# Patient Record
Sex: Male | Born: 1960 | Race: White | Hispanic: No | Marital: Married | Smoking: Current every day smoker
Health system: Southern US, Community
[De-identification: ages and names within clinical notes are randomized; demographics above are authoritative.]

## PROBLEM LIST (undated history)

## (undated) DIAGNOSIS — M1990 Unspecified osteoarthritis, unspecified site: Secondary | ICD-10-CM

## (undated) DIAGNOSIS — B20 Human immunodeficiency virus [HIV] disease: Secondary | ICD-10-CM

## (undated) DIAGNOSIS — F32A Depression, unspecified: Secondary | ICD-10-CM

## (undated) DIAGNOSIS — A539 Syphilis, unspecified: Secondary | ICD-10-CM

## (undated) DIAGNOSIS — T7840XA Allergy, unspecified, initial encounter: Secondary | ICD-10-CM

## (undated) DIAGNOSIS — M8700 Idiopathic aseptic necrosis of unspecified bone: Secondary | ICD-10-CM

## (undated) DIAGNOSIS — E785 Hyperlipidemia, unspecified: Secondary | ICD-10-CM

## (undated) DIAGNOSIS — F1511 Other stimulant abuse, in remission: Secondary | ICD-10-CM

## (undated) DIAGNOSIS — F329 Major depressive disorder, single episode, unspecified: Secondary | ICD-10-CM

## (undated) HISTORY — PX: CATARACT EXTRACTION: SUR2

## (undated) HISTORY — DX: Depression, unspecified: F32.A

## (undated) HISTORY — DX: Other stimulant abuse, in remission: F15.11

## (undated) HISTORY — DX: Idiopathic aseptic necrosis of unspecified bone: M87.00

## (undated) HISTORY — DX: Allergy, unspecified, initial encounter: T78.40XA

## (undated) HISTORY — DX: Syphilis, unspecified: A53.9

## (undated) HISTORY — DX: Human immunodeficiency virus (HIV) disease: B20

## (undated) HISTORY — DX: Major depressive disorder, single episode, unspecified: F32.9

## (undated) HISTORY — PX: YAG LASER APPLICATION: SHX6189

---
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
## 2010-08-25 DIAGNOSIS — B20 Human immunodeficiency virus [HIV] disease: Secondary | ICD-10-CM

## 2010-08-25 DIAGNOSIS — Z21 Asymptomatic human immunodeficiency virus [HIV] infection status: Secondary | ICD-10-CM

## 2010-08-25 HISTORY — DX: Asymptomatic human immunodeficiency virus (hiv) infection status: Z21

## 2010-08-25 HISTORY — DX: Human immunodeficiency virus (HIV) disease: B20

## 2017-01-31 ENCOUNTER — Other Ambulatory Visit: Payer: Self-pay | Admitting: *Deleted

## 2017-01-31 DIAGNOSIS — Z79899 Other long term (current) drug therapy: Secondary | ICD-10-CM

## 2017-01-31 DIAGNOSIS — B20 Human immunodeficiency virus [HIV] disease: Secondary | ICD-10-CM

## 2017-01-31 DIAGNOSIS — Z113 Encounter for screening for infections with a predominantly sexual mode of transmission: Secondary | ICD-10-CM

## 2017-02-03 ENCOUNTER — Ambulatory Visit: Payer: 59

## 2017-02-03 ENCOUNTER — Other Ambulatory Visit: Payer: 59

## 2017-02-03 ENCOUNTER — Other Ambulatory Visit (HOSPITAL_COMMUNITY)
Admission: RE | Admit: 2017-02-03 | Discharge: 2017-02-03 | Disposition: A | Discharge status: Home / Self Care | Payer: 59 | Source: Ambulatory Visit | Attending: Infectious Diseases | Admitting: Infectious Diseases

## 2017-02-03 DIAGNOSIS — Z113 Encounter for screening for infections with a predominantly sexual mode of transmission: Secondary | ICD-10-CM | POA: Diagnosis not present

## 2017-02-03 DIAGNOSIS — B20 Human immunodeficiency virus [HIV] disease: Secondary | ICD-10-CM

## 2017-02-03 DIAGNOSIS — Z79899 Other long term (current) drug therapy: Secondary | ICD-10-CM

## 2017-02-03 LAB — T-HELPER CELL (CD4) - (RCID CLINIC ONLY)
CD4 % Helper T Cell: 37 % (ref 33–55)
CD4 T Cell Abs: 880 /uL (ref 400–2700)

## 2017-02-04 LAB — URINE CYTOLOGY ANCILLARY ONLY
Chlamydia: NEGATIVE
Neisseria Gonorrhea: NEGATIVE

## 2017-02-04 LAB — URINALYSIS
Bilirubin Urine: NEGATIVE
Glucose, UA: NEGATIVE
Hgb urine dipstick: NEGATIVE
Ketones, ur: NEGATIVE
Leukocytes, UA: NEGATIVE
Nitrite: NEGATIVE
Protein, ur: NEGATIVE
Specific Gravity, Urine: 1.014 (ref 1.001–1.03)
pH: 6 (ref 5.0–8.0)

## 2017-02-05 LAB — QUANTIFERON-TB GOLD PLUS
Mitogen-NIL: >10 IU/mL
NIL: 0.02 IU/mL
QuantiFERON-TB Gold Plus: NEGATIVE
TB1-NIL: 0 IU/mL
TB2-NIL: 0 IU/mL

## 2017-02-06 ENCOUNTER — Encounter: Payer: Self-pay | Admitting: Infectious Diseases

## 2017-02-06 DIAGNOSIS — Z789 Other specified health status: Secondary | ICD-10-CM | POA: Insufficient documentation

## 2017-02-07 LAB — HIV-1 RNA ULTRAQUANT REFLEX TO GENTYP+
HIV 1 RNA Quant: 37 Copies/mL — ABNORMAL HIGH
HIV-1 RNA Quant, Log: 1.56 Log cps/mL — ABNORMAL HIGH

## 2017-02-08 ENCOUNTER — Encounter: Payer: Self-pay | Admitting: Infectious Diseases

## 2017-02-08 DIAGNOSIS — Z789 Other specified health status: Secondary | ICD-10-CM | POA: Insufficient documentation

## 2017-02-08 LAB — CBC WITH DIFFERENTIAL/PLATELET
Basophils Absolute: 72 cells/uL (ref 0–200)
Basophils Relative: 1.1 %
Eosinophils Absolute: 260 cells/uL (ref 15–500)
Eosinophils Relative: 4 %
HCT: 42.4 % (ref 38.5–50.0)
Hemoglobin: 14.9 g/dL (ref 13.2–17.1)
Lymphs Abs: 2288 cells/uL (ref 850–3900)
MCH: 31.6 pg (ref 27.0–33.0)
MCHC: 35.1 g/dL (ref 32.0–36.0)
MCV: 89.8 fL (ref 80.0–100.0)
MPV: 9.2 fL (ref 7.5–12.5)
Monocytes Relative: 6.1 %
Neutro Abs: 3484 cells/uL (ref 1500–7800)
Neutrophils Relative %: 53.6 %
Platelets: 346 10*3/uL (ref 140–400)
RBC: 4.72 10*6/uL (ref 4.20–5.80)
RDW: 13.3 % (ref 11.0–15.0)
Total Lymphocyte: 35.2 %
WBC mixed population: 397 cells/uL (ref 200–950)
WBC: 6.5 10*3/uL (ref 3.8–10.8)

## 2017-02-08 LAB — HEPATITIS B CORE ANTIBODY, TOTAL: Hep B Core Total Ab: NONREACTIVE

## 2017-02-08 LAB — HEPATITIS B SURFACE ANTIBODY,QUALITATIVE: Hep B S Ab: REACTIVE — AB

## 2017-02-08 LAB — HEPATITIS A ANTIBODY, TOTAL: Hepatitis A AB,Total: REACTIVE — AB

## 2017-02-08 LAB — COMPLETE METABOLIC PANEL WITH GFR
AG Ratio: 1.7 (calc) (ref 1.0–2.5)
ALT: 25 U/L (ref 9–46)
AST: 17 U/L (ref 10–35)
Albumin: 4.5 g/dL (ref 3.6–5.1)
Alkaline phosphatase (APISO): 83 U/L (ref 40–115)
BUN: 14 mg/dL (ref 7–25)
CO2: 29 mmol/L (ref 20–32)
Calcium: 9.8 mg/dL (ref 8.6–10.3)
Chloride: 103 mmol/L (ref 98–110)
Creat: 0.98 mg/dL (ref 0.70–1.33)
GFR, Est African American: 101 mL/min/{1.73_m2} (ref >=60)
GFR, Est Non African American: 87 mL/min/{1.73_m2} (ref >=60)
Globulin: 2.6 g/dL (calc) (ref 1.9–3.7)
Glucose, Bld: 136 mg/dL — ABNORMAL HIGH (ref 65–99)
Potassium: 4.7 mmol/L (ref 3.5–5.3)
Sodium: 139 mmol/L (ref 135–146)
Total Bilirubin: 0.4 mg/dL (ref 0.2–1.2)
Total Protein: 7.1 g/dL (ref 6.1–8.1)

## 2017-02-08 LAB — LIPID PANEL
Cholesterol: 394 mg/dL — ABNORMAL HIGH (ref <200)
HDL: 47 mg/dL (ref >40)
Non-HDL Cholesterol (Calc): 347 mg/dL (calc) — ABNORMAL HIGH (ref <130)
Total CHOL/HDL Ratio: 8.4 (calc) — ABNORMAL HIGH (ref <5.0)
Triglycerides: 752 mg/dL — ABNORMAL HIGH (ref <150)

## 2017-02-08 LAB — RPR: RPR Ser Ql: REACTIVE — AB

## 2017-02-08 LAB — HLA B*5701: HLA-B*5701 w/rflx HLA-B High: NEGATIVE

## 2017-02-08 LAB — HEPATITIS B SURFACE ANTIGEN: Hepatitis B Surface Ag: NONREACTIVE

## 2017-02-08 LAB — FLUORESCENT TREPONEMAL AB(FTA)-IGG-BLD: Fluorescent Treponemal ABS: REACTIVE — AB

## 2017-02-08 LAB — HEPATITIS C ANTIBODY
Hepatitis C Ab: NONREACTIVE
SIGNAL TO CUT-OFF: 0.02 (ref <1.00)

## 2017-02-08 LAB — RPR TITER: RPR Titer: 1:8 {titer} — ABNORMAL HIGH

## 2017-02-22 ENCOUNTER — Ambulatory Visit (INDEPENDENT_AMBULATORY_CARE_PROVIDER_SITE_OTHER): Payer: 59 | Admitting: Licensed Clinical Social Worker

## 2017-02-22 ENCOUNTER — Ambulatory Visit (INDEPENDENT_AMBULATORY_CARE_PROVIDER_SITE_OTHER): Payer: 59 | Admitting: Pharmacist

## 2017-02-22 ENCOUNTER — Ambulatory Visit: Payer: 59 | Admitting: Infectious Diseases

## 2017-02-22 ENCOUNTER — Encounter: Payer: Self-pay | Admitting: Infectious Diseases

## 2017-02-22 ENCOUNTER — Telehealth: Payer: Self-pay | Admitting: Pharmacist

## 2017-02-22 VITALS — BP 135/71 | HR 84 | Temp 97.5°F | Ht 74.0 in | Wt 207.0 lb

## 2017-02-22 DIAGNOSIS — E782 Mixed hyperlipidemia: Secondary | ICD-10-CM

## 2017-02-22 DIAGNOSIS — F329 Major depressive disorder, single episode, unspecified: Secondary | ICD-10-CM | POA: Diagnosis not present

## 2017-02-22 DIAGNOSIS — B20 Human immunodeficiency virus [HIV] disease: Secondary | ICD-10-CM

## 2017-02-22 DIAGNOSIS — E785 Hyperlipidemia, unspecified: Secondary | ICD-10-CM | POA: Diagnosis not present

## 2017-02-22 DIAGNOSIS — Z634 Disappearance and death of family member: Secondary | ICD-10-CM

## 2017-02-22 DIAGNOSIS — A539 Syphilis, unspecified: Secondary | ICD-10-CM | POA: Insufficient documentation

## 2017-02-22 DIAGNOSIS — F32A Depression, unspecified: Secondary | ICD-10-CM

## 2017-02-22 DIAGNOSIS — J302 Other seasonal allergic rhinitis: Secondary | ICD-10-CM

## 2017-02-22 DIAGNOSIS — M8700 Idiopathic aseptic necrosis of unspecified bone: Secondary | ICD-10-CM | POA: Diagnosis not present

## 2017-02-22 MED ORDER — CETIRIZINE HCL 10 MG PO TABS: 10.0000 mg | ORAL_TABLET | Freq: Every day | ORAL | Status: DC

## 2017-02-22 MED ORDER — ATORVASTATIN CALCIUM 10 MG PO TABS
10.0000 mg | ORAL_TABLET | Freq: Every day | ORAL | 3 refills | Status: DC
Start: 2017-02-22 — End: 2017-11-27

## 2017-02-22 NOTE — BH Specialist Note (Signed)
Integrated Behavioral Health Initial Visit  MRN: [id number] Name: Jason Townsend  Number of [place] Clinician visits:: 1/6 Session Start time: 9:59 am  Session End time: 10:04 am Total time: 5 mins  Type of Service: [place] Interpretor:No. Interpretor Name and Language: N/A   Warm Hand Off Completed.       SUBJECTIVE: Jason Townsend is a 57 y.o. male accompanied by self Patient was referred by Dr. Johnnye Sima for being a new HIV referral.  Patient reports the following symptoms/concerns: Patient reported that he is well adjusted to his diagnosis as he was diagnosed in 2012.  Patient reported having good social support and denied need for support groups.  Patient reported that his sleep is "situational" as his father passed away last week.  [facility] educated patient on the grief counseling and support groups offered at [facility] and he stated that he was already familiar with their services.  Patient stated that he will seek services from them when he is ready but he was better prepared for his father's passing, as he was receiving hospice services.  Patient denied need for additional resources or counseling services.  Duration of problem: past week; Severity of problem: mild  OBJECTIVE: Mood: Euthymic and Affect: Appropriate Risk of harm to self or others: No plan to harm self or others  ASSESSMENT: Patient is currently experiencing grief from his father's death and may benefit from grief counseling when he is ready.  GOALS ADDRESSED: Patient will: 1. Reduce symptoms of: grief 2. Increase knowledge and/or ability of: coping skills and stages of grief  3. Demonstrate ability to: Increase healthy adjustment to current life circumstances and Begin healthy grieving over loss  INTERVENTIONS: Interventions utilized: Supportive Counseling and Link to [employer]   PLAN: 1. Patient was provided with a  business card and will contact the [facility] for additional assistance if needed.   2. Referral(s): grief counseling at [place], [place]

## 2017-02-22 NOTE — Telephone Encounter (Signed)
Thanks so much. 

## 2017-02-22 NOTE — Progress Notes (Signed)
[place] for Infectious Disease Pharmacy Visit  HPI: Jason Townsend is a 57 y.o. male who presents to the [facility] clinic today to initiate care with Dr. Johnnye Sima as a transfer HIV patient.  Patient Active Problem List   Diagnosis Date Noted  . HIV disease ([place]) 02/22/2017  . Hyperlipidemia 02/22/2017  . AVN (avascular necrosis of bone) ([place]) 02/22/2017  . Syphilis 02/22/2017  . Depression (emotion) 02/22/2017  . Hepatitis A immune 02/08/2017  . Hepatitis B immune 02/06/2017    Patient's Medications  New Prescriptions   No medications on file  Previous Medications   ACETAMINOPHEN (TYLENOL) 500 MG TABLET    Take by mouth.   ATORVASTATIN (LIPITOR) 10 MG TABLET    Take 1 tablet (10 mg total) by mouth daily.   DICLOFENAC (VOLTAREN) 75 MG EC TABLET    Take by mouth.   IBUPROFEN (ADVIL,MOTRIN) 200 MG TABLET    Take by mouth.   MULTIPLE VITAMINS-MINERALS (THERA-M) TABS    Take by mouth.   TRIUMEQ 600-50-300 MG TABLET    TK 1 T PO QD  Modified Medications   No medications on file  Discontinued Medications   No medications on file    Allergies: Not on File  Past Medical History: Past Medical History:  Diagnosis Date  . AVN (avascular necrosis of bone) ([facility])    L hip  . Depression   . HIV (human immunodeficiency virus infection) ([place]) 08/2010   HLA (-), previous K103N  . 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 Syphilis    treated    Social History: Social History   Socioeconomic History  . Marital status: Soil scientist    Spouse name: Not on file  . Number of children: Not on file  . Years of education: Not on file  . Highest education level: Not on file  Social Needs  . Financial resource strain: Not on file  . Food insecurity - worry: Not on file  . Food insecurity - inability: Not on file  . Transportation needs - medical: Not on file  . Transportation needs - non-medical: Not on file  Occupational History  . Not on file  Tobacco Use  . Smoking  status: Current Every Day Smoker    Packs/day: 0.50    Types: Cigarettes  . Smokeless tobacco: Never Used  . Tobacco comment: talking about it   Substance and Sexual Activity  . Alcohol use: No    Frequency: Never  . Drug use: No  . Sexual activity: Yes    Partners: Male  Other Topics Concern  . Not on file  Social History Narrative  . Not on file    Labs: HIV 1 RNA Quant (Copies/mL)  Date Value  02/03/2017 37 (H)   CD4 T Cell Abs (/uL)  Date Value  02/03/2017 880   Hep B S Ab (no units)  Date Value  02/03/2017 REACTIVE (A)   Hepatitis B Surface Ag (no units)  Date Value  02/03/2017 NON-REACTIVE    Lipids:    Component Value Date/Time   CHOL 394 (H) 02/03/2017 0930   TRIG 752 (H) 02/03/2017 0930   HDL 47 02/03/2017 0930   CHOLHDL 8.4 (H) 02/03/2017 0930    Current HIV Regimen: Triumeq  Assessment: Jason Townsend is here today to initiate care with Dr. Johnnye Sima as a transfer HIV patient. He was diagnosed back in 2012 and has been on Triumeq since 2015. He is doing very well on Triumeq with no side effects or  issues taking it.  Dr. Johnnye Sima would also like to start him on a low dose statin for his cholesterol (total 394, TG 752). I counseled him on how to take Lipitor and possible side effects like myalgias. He will come back and see me in 6 weeks to do LFTs, recheck lipids, and see how he is tolerating it.  Plan: - Continue Triumeq PO once daily - Start Lipitor 10 mg PO once daily  - F/u with me 3/13 at 930am  Ichelle Harral L. Sherran Margolis, PharmD, [place], [place] for Infectious Disease 02/22/2017, 10:02 AM

## 2017-02-22 NOTE — Assessment & Plan Note (Signed)
Feels like he is in a good place, wants to hold on meeting counselor today.

## 2017-02-22 NOTE — Telephone Encounter (Signed)
Called Jason Townsend to help him with a co-pay card for his Triumeq.  Activated the card for him and gave him all the numbers.  I told him to call me if he has any issues.  ID: [id number] BIN: [id number] PCN: [id number] Group: [id number]

## 2017-02-22 NOTE — Assessment & Plan Note (Addendum)
vax are uptodate Colonoscopy pending.  Tet not done since '83, will update Doing well on his triumeq.  Explained clinic to him In committed relationship. Positive.  rtc in 3 months.

## 2017-02-22 NOTE — Assessment & Plan Note (Signed)
Previously treated with 2 wks PEN.  Will follow [facility].

## 2017-02-22 NOTE — Progress Notes (Signed)
   Subjective:    Patient ID: Jason Townsend, male    DOB: Nov 29, 1960, 57 y.o.   MRN: [id number]  HPI 57 yo M with hx of HIV+ (dx 08-2010, currently on triumeq 2013/07/06), initially on truvada/darunavir-norvir/intelence, HLA-, prev K103N), previously followed at [facility].  He also has a hx of depression and sees a mental health provider. Prev partner died from suicide 07/07/15.  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 Marland Kitchen  His father died last week (dementia, subdural after fall).  Moved here in October (works at [employer]).  Getting married next month.   HIV 1 RNA Quant (Copies/mL)  Date Value  02/03/2017 37 (H)   CD4 T Cell Abs (/uL)  Date Value  02/03/2017 880   The past medical history, family history and social history were reviewed/updated in EPIC   Review of Systems  Constitutional: Negative for appetite change, chills, fever and unexpected weight change.  Respiratory: Negative for cough and shortness of breath.   Gastrointestinal: Negative for constipation and diarrhea.  Genitourinary: Negative for difficulty urinating.  Musculoskeletal: Positive for arthralgias.  Psychiatric/Behavioral: Negative for dysphoric mood.  occas exercise.  Please see HPI. All other systems reviewed and negative.      Objective:   Physical Exam  Constitutional: He appears well-developed and well-nourished.  HENT:  Mouth/Throat: No oropharyngeal exudate.  Eyes: EOM are normal. Pupils are equal, round, and reactive to light.  Neck: Neck supple.  Cardiovascular: Normal rate, regular rhythm and normal heart sounds.  Pulmonary/Chest: Effort normal and breath sounds normal.  Abdominal: Soft. Bowel sounds are normal. There is no tenderness. There is no rebound.  Musculoskeletal: He exhibits no edema.  Lymphadenopathy:    He has no cervical adenopathy.  Psychiatric: He has a  normal mood and affect.      Assessment & Plan:

## 2017-02-22 NOTE — Assessment & Plan Note (Signed)
He is well controlled on his voltaren.  Will have him seen by ortho.

## 2017-02-22 NOTE — Assessment & Plan Note (Signed)
Will start him on low dose lipitor, cautioned about myalgias.

## 2017-02-27 ENCOUNTER — Encounter: Payer: Self-pay | Admitting: Behavioral Health

## 2017-03-13 ENCOUNTER — Ambulatory Visit (INDEPENDENT_AMBULATORY_CARE_PROVIDER_SITE_OTHER): Payer: Self-pay | Admitting: Orthopaedic Surgery

## 2017-03-20 ENCOUNTER — Encounter: Payer: Self-pay | Admitting: Infectious Diseases

## 2017-03-21 NOTE — Telephone Encounter (Signed)
Patient is a [employer], needs to have labs drawn there and faxed to [place].  He is supposed to follow up with pharmacy 3/13 for liver function tests on statin treatment. Please write paper script for labs to be drawn at [facility] before his pharmacy visit. Please also write a 2nd script for any labs you'd like drawn for his 3 month follow up on 5/20. Landis Gandy, RN

## 2017-03-31 ENCOUNTER — Other Ambulatory Visit: Payer: Self-pay | Admitting: *Deleted

## 2017-03-31 DIAGNOSIS — B20 Human immunodeficiency virus [HIV] disease: Secondary | ICD-10-CM

## 2017-03-31 MED ORDER — TRIUMEQ 600-50-300 MG PO TABS: ORAL_TABLET | ORAL | 5 refills | Status: DC

## 2017-04-03 ENCOUNTER — Encounter: Payer: Self-pay | Admitting: Infectious Diseases

## 2017-04-05 ENCOUNTER — Ambulatory Visit: Payer: 59

## 2017-04-18 ENCOUNTER — Encounter: Payer: Self-pay | Admitting: Infectious Diseases

## 2017-04-26 ENCOUNTER — Encounter: Payer: Self-pay | Admitting: Infectious Diseases

## 2017-05-03 ENCOUNTER — Encounter: Payer: Self-pay | Admitting: Infectious Diseases

## 2017-05-15 ENCOUNTER — Ambulatory Visit (INDEPENDENT_AMBULATORY_CARE_PROVIDER_SITE_OTHER): Payer: 59

## 2017-05-15 ENCOUNTER — Ambulatory Visit (INDEPENDENT_AMBULATORY_CARE_PROVIDER_SITE_OTHER): Payer: 59 | Admitting: Orthopaedic Surgery

## 2017-05-15 VITALS — Ht 74.0 in | Wt 197.0 lb

## 2017-05-15 DIAGNOSIS — M25552 Pain in left hip: Secondary | ICD-10-CM

## 2017-05-15 DIAGNOSIS — M1612 Unilateral primary osteoarthritis, left hip: Secondary | ICD-10-CM | POA: Diagnosis not present

## 2017-05-15 NOTE — Progress Notes (Signed)
Office Visit Note   Patient: Jason Townsend           Date of Birth: [birth year]           MRN: [id number] Visit Date: 05/15/2017              Requested by: Campbell Riches, MD [address] [place], [place] [address] PCP: Patient, No Pcp Per   Assessment & Plan: Visit Diagnoses:  1. Pain in left hip   2. Unilateral primary osteoarthritis, left hip     Plan: Given his physical exam and x-ray findings this is not avascular necrosis but is severe osteoarthritis of his left hip.  He is already tried and failed all forms conservative treatment.  He does not even have a joint space left to provide a steroid injection in and I would not recommend this at all.  Given the fact that he has tried activity modification as well as anti-inflammatories and hip strengthening exercises as well as given this time of over 5 years to get better and given the fact that his pain is severe and his x-ray findings are severe we are recommending hip replacement.  I gave him a handout on anterior hip replacement surgery.  We went over his x-rays in detail and had a long and thorough discussion about the risk and benefits of the surgery as well as what his intraoperative and postoperative course were involved.  All questions concerns were answered and addressed.  I gave him our surgery schedulers card and we will also be in touch looking at both my schedule in his schedule.  Follow-Up Instructions: Return for 2 weeks post-op.   Orders:  Orders Placed This Encounter  Procedures  . XR HIP UNILAT W OR W/O PELVIS 2-3 VIEWS LEFT   No orders of the defined types were placed in this encounter.     Procedures: No procedures performed   Clinical Data: No additional findings.   Subjective: Chief Complaint  Patient presents with  . Left Hip - Pain  Patient is a very pleasant 57 year old gentleman who comes in for evaluation treatment of left hip pain.  He is actually lived in both [place]  in [place] and now lives in [place].  He has been having severe and significant left hip pain since about 2012.  He has been told by several different doctors that they are concerned about avascular necrosis based on his stiffness and pain.  He is HIV positive but has an undetectable viral load and a normal CD4 count.  He is tried anti-inflammatories.  He is tried activity modification.  He is a thin individual.  He is worked on hip strengthening exercises as well.  At this point his pain can be 10 out of 10 at times.  It is detrimentally affect is active daily living, his quality of life, his mobility.  Again he is tried conservative treatment now for over 5 years for his left hip.  He is now seeking further evaluation and treatment of this issue.  He does have a dull aching pain at night that does wake him up.  He hurts with pivoting activities.  Driving or sitting for any long period of time does cause severe pain when he gets up.  His pain does wake him up at night as well.  HPI  Review of Systems He currently denies any headache, chest pain, shortness of breath, fever, chills, nausea, vomiting.  Objective: Vital Signs: Ht 6' 2"  (  1.88 m)   Wt 197 lb (89.4 kg)   BMI 25.29 kg/m   Physical Exam He is alert and oriented x3 and in no acute distress Ortho Exam Examination of his right hip is normal examination of his left hip shows severe stiffness with any internal and external rotation and I cannot rotate him fully.  All his pain is in the groin.  His knee foot and ankle exam is normal. Specialty Comments:  No specialty comments available.  Imaging: Xr Hip Unilat W Or W/o Pelvis 2-3 Views Left  Result Date: 05/15/2017 An AP pelvis and lateral of the left hip show severe end-stage arthritis of the left hip.  There is complete loss of the joint space.  There is cystic change in the femoral head and the acetabulum.  There are sclerotic changes as well.  There are large para-articular  osteophytes.    PMFS History: Patient Active Problem List   Diagnosis Date Noted  . Unilateral primary osteoarthritis, left hip 05/15/2017  . HIV disease ([place]) 02/22/2017  . Hyperlipidemia 02/22/2017  . AVN (avascular necrosis of bone) ([place]) 02/22/2017  . Syphilis 02/22/2017  . Depression (emotion) 02/22/2017  . Hepatitis A immune 02/08/2017  . Hepatitis B immune 02/06/2017   Past Medical History:  Diagnosis Date  . AVN (avascular necrosis of bone) (H[facility])    L hip  . Depression   . HIV (human immunodeficiency virus infection) ([place]) 08/2010   HLA (-), previous K103N  . 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 Syphilis    treated    Family History  Problem Relation Age of Onset  . Dementia Father     Past Surgical History:  Procedure Laterality Date  . CATARACT EXTRACTION Left    Social History   Occupational History  . Not on file  Tobacco Use  . Smoking status: Current Every Day Smoker    Packs/day: 0.50    Types: Cigarettes  . Smokeless tobacco: Never Used  . Tobacco comment: talking about it   Substance and Sexual Activity  . Alcohol use: No    Frequency: Never  . Drug use: No  . Sexual activity: Yes    Partners: Male

## 2017-05-17 ENCOUNTER — Encounter (INDEPENDENT_AMBULATORY_CARE_PROVIDER_SITE_OTHER): Payer: Self-pay | Admitting: Orthopaedic Surgery

## 2017-06-09 ENCOUNTER — Encounter (INDEPENDENT_AMBULATORY_CARE_PROVIDER_SITE_OTHER): Payer: Self-pay | Admitting: Orthopaedic Surgery

## 2017-06-12 ENCOUNTER — Ambulatory Visit: Payer: 59 | Admitting: Infectious Diseases

## 2017-06-12 ENCOUNTER — Other Ambulatory Visit (INDEPENDENT_AMBULATORY_CARE_PROVIDER_SITE_OTHER): Payer: Self-pay | Admitting: Physician Assistant

## 2017-06-12 ENCOUNTER — Other Ambulatory Visit (HOSPITAL_COMMUNITY): Payer: Self-pay | Admitting: *Deleted

## 2017-06-12 NOTE — Patient Instructions (Addendum)
Jason Townsend  06/12/2017   Your procedure is scheduled on: 06-23-17  Report to [facility] Main  Entrance  Report to admitting at  945 am    Call this number if you have problems the morning of surgery [phone]   Remember: Do not eat food or drink liquids  :After Midnight. .                                                      _____________________________________________________________________       Take these medicines the morning of surgery with A SIP OF WATER: ATORVASTATIN (LIPITOR), LORATADINE (CLARITIN), TRIUMEQ                               You may not have any metal on your body including hair pins and              piercings  Do not wear jewelry, make-up, lotions, powders or perfumes, deodorant             Do not wear nail polish.  Do not shave  48 hours prior to surgery.              Men may shave face and neck.   Do not bring valuables to the hospital. [place].  Contacts, dentures or bridgework may not be worn into surgery.  Leave suitcase in the car. After surgery it may be brought to your room.                  Please read over the following fact sheets you were given: _____________________________________________________________________             [facility] - Preparing for Surgery Before surgery, you can play an important role.  Because skin is not sterile, your skin needs to be as free of germs as possible.  You can reduce the number of germs on your skin by washing with CHG (chlorahexidine gluconate) soap before surgery.  CHG is an antiseptic cleaner which kills germs and bonds with the skin to continue killing germs even after washing. Please DO NOT use if you have an allergy to CHG or antibacterial soaps.  If your skin becomes reddened/irritated stop using the CHG and inform your nurse when you arrive at Short Stay. Do not shave (including legs and underarms) for at  least 48 hours prior to the first CHG shower.  You may shave your face/neck. Please follow these instructions carefully:  1.  Shower with CHG Soap the night before surgery and the  morning of Surgery.  2.  If you choose to wash your hair, wash your hair first as usual with your  normal  shampoo.  3.  After you shampoo, rinse your hair and body thoroughly to remove the  shampoo.                           4.  Use CHG as you would any other liquid soap.  You can apply chg directly  to the skin and  wash                       Gently with a scrungie or clean washcloth.  5.  Apply the CHG Soap to your body ONLY FROM THE NECK DOWN.   Do not use on face/ open                           Wound or open sores. Avoid contact with eyes, ears mouth and genitals (private parts).                       Wash face,  Genitals (private parts) with your normal soap.             6.  Wash thoroughly, paying special attention to the area where your surgery  will be performed.  7.  Thoroughly rinse your body with warm water from the neck down.  8.  DO NOT shower/wash with your normal soap after using and rinsing off  the CHG Soap.                9.  Pat yourself dry with a clean towel.            10.  Wear clean pajamas.            11.  Place clean sheets on your bed the night of your first shower and do not  sleep with pets. Day of Surgery : Do not apply any lotions/deodorants the morning of surgery.  Please wear clean clothes to the hospital/surgery center.  FAILURE TO FOLLOW THESE INSTRUCTIONS MAY RESULT IN THE CANCELLATION OF YOUR SURGERY PATIENT SIGNATURE_________________________________  NURSE SIGNATURE__________________________________  ________________________________________________________________________   Jason Townsend  An incentive spirometer is a tool that can help keep your lungs clear and active. This tool measures how well you are filling your lungs with each breath. Taking long deep breaths  may help reverse or decrease the chance of developing breathing (pulmonary) problems (especially infection) following:  A long period of time when you are unable to move or be active. BEFORE THE PROCEDURE   If the spirometer includes an indicator to show your best effort, your nurse or respiratory therapist will set it to a desired goal.  If possible, sit up straight or lean slightly forward. Try not to slouch.  Hold the incentive spirometer in an upright position. INSTRUCTIONS FOR USE  1. Sit on the edge of your bed if possible, or sit up as far as you can in bed or on a chair. 2. Hold the incentive spirometer in an upright position. 3. Breathe out normally. 4. Place the mouthpiece in your mouth and seal your lips tightly around it. 5. Breathe in slowly and as deeply as possible, raising the piston or the ball toward the top of the column. 6. Hold your breath for 3-5 seconds or for as long as possible. Allow the piston or ball to fall to the bottom of the column. 7. Remove the mouthpiece from your mouth and breathe out normally. 8. Rest for a few seconds and repeat Steps 1 through 7 at least 10 times every 1-2 hours when you are awake. Take your time and take a few normal breaths between deep breaths. 9. The spirometer may include an indicator to show your best effort. Use the indicator as a goal to work toward during each repetition. 10. After each set of 10 deep  breaths, practice coughing to be sure your lungs are clear. If you have an incision (the cut made at the time of surgery), support your incision when coughing by placing a pillow or rolled up towels firmly against it. Once you are able to get out of bed, walk around indoors and cough well. You may stop using the incentive spirometer when instructed by your caregiver.  RISKS AND COMPLICATIONS  Take your time so you do not get dizzy or light-headed.  If you are in pain, you may need to take or ask for pain medication before doing  incentive spirometry. It is harder to take a deep breath if you are having pain. AFTER USE  Rest and breathe slowly and easily.  It can be helpful to keep track of a log of your progress. Your caregiver can provide you with a simple table to help with this. If you are using the spirometer at home, follow these instructions: [place] IF:   You are having difficultly using the spirometer.  You have trouble using the spirometer as often as instructed.  Your pain medication is not giving enough relief while using the spirometer.  You develop fever of 100.5 F (38.1 C) or higher. SEEK IMMEDIATE MEDICAL CARE IF:   You cough up bloody sputum that had not been present before.  You develop fever of 102 F (38.9 C) or greater.  You develop worsening pain at or near the incision site. MAKE SURE YOU:   Understand these instructions.  Will watch your condition.  Will get help right away if you are not doing well or get worse. Document Released: 05/23/2006 Document Revised: 04/04/2011 Document Reviewed: 07/24/2006 ExitCare Patient Information 2014 Exit[employer], [place].   ________________________________________________________________________  WHAT IS A BLOOD TRANSFUSION? Blood Transfusion Information  A transfusion is the replacement of blood or some of its parts. Blood is made up of multiple cells which provide different functions.  Red blood cells carry oxygen and are used for blood loss replacement.  White blood cells fight against infection.  Platelets control bleeding.  Plasma helps clot blood.  Other blood products are available for specialized needs, such as hemophilia or other clotting disorders. BEFORE THE TRANSFUSION  Who gives blood for transfusions?   Healthy volunteers who are fully evaluated to make sure their blood is safe. This is blood bank blood. Transfusion therapy is the safest it has ever been in the practice of medicine. Before blood is taken from a  donor, a complete history is taken to make sure that person has no history of diseases nor engages in risky social behavior (examples are intravenous drug use or sexual activity with multiple partners). The donor's travel history is screened to minimize risk of transmitting infections, such as malaria. The donated blood is tested for signs of infectious diseases, such as HIV and hepatitis. The blood is then tested to be sure it is compatible with you in order to minimize the chance of a transfusion reaction. If you or a relative donates blood, this is often done in anticipation of surgery and is not appropriate for emergency situations. It takes many days to process the donated blood. RISKS AND COMPLICATIONS Although transfusion therapy is very safe and saves many lives, the main dangers of transfusion include:   Getting an infectious disease.  Developing a transfusion reaction. This is an allergic reaction to something in the blood you were given. Every precaution is taken to prevent this. The decision to have a blood transfusion has  been considered carefully by your caregiver before blood is given. Blood is not given unless the benefits outweigh the risks. AFTER THE TRANSFUSION  Right after receiving a blood transfusion, you will usually feel much better and more energetic. This is especially true if your red blood cells have gotten low (anemic). The transfusion raises the level of the red blood cells which carry oxygen, and this usually causes an energy increase.  The nurse administering the transfusion will monitor you carefully for complications. HOME CARE INSTRUCTIONS  No special instructions are needed after a transfusion. You may find your energy is better. Speak with your caregiver about any limitations on activity for underlying diseases you may have. SEEK MEDICAL CARE IF:   Your condition is not improving after your transfusion.  You develop redness or irritation at the intravenous (IV)  site. SEEK IMMEDIATE MEDICAL CARE IF:  Any of the following symptoms occur over the next 12 hours:  Shaking chills.  You have a temperature by mouth above 102 F (38.9 C), not controlled by medicine.  Chest, back, or muscle pain.  People around you feel you are not acting correctly or are confused.  Shortness of breath or difficulty breathing.  Dizziness and fainting.  You get a rash or develop hives.  You have a decrease in urine output.  Your urine turns a dark color or changes to pink, red, or brown. Any of the following symptoms occur over the next 10 days:  You have a temperature by mouth above 102 F (38.9 C), not controlled by medicine.  Shortness of breath.  Weakness after normal activity.  The white part of the eye turns yellow (jaundice).  You have a decrease in the amount of urine or are urinating less often.  Your urine turns a dark color or changes to pink, red, or brown. Document Released: 01/08/2000 Document Revised: 04/04/2011 Document Reviewed: 08/27/2007 [facility] Patient Information 2014 [place], [place].  _______________________________________________________________________

## 2017-06-12 NOTE — Progress Notes (Signed)
NEED ORDERS IN Epic FOR 06-23-17 SURGERY PRE OP IS 06-13-17 AT 800 AM THANKS

## 2017-06-13 ENCOUNTER — Other Ambulatory Visit: Payer: Self-pay

## 2017-06-13 ENCOUNTER — Encounter (HOSPITAL_COMMUNITY)
Admission: RE | Admit: 2017-06-13 | Discharge: 2017-06-13 | Disposition: A | Discharge status: Home / Self Care | Payer: 59 | Source: Ambulatory Visit | Attending: Orthopaedic Surgery | Admitting: Orthopaedic Surgery

## 2017-06-13 ENCOUNTER — Encounter (HOSPITAL_COMMUNITY): Payer: Self-pay

## 2017-06-13 DIAGNOSIS — Z01812 Encounter for preprocedural laboratory examination: Secondary | ICD-10-CM | POA: Diagnosis not present

## 2017-06-13 DIAGNOSIS — M1612 Unilateral primary osteoarthritis, left hip: Secondary | ICD-10-CM | POA: Insufficient documentation

## 2017-06-13 HISTORY — DX: Hyperlipidemia, unspecified: E78.5

## 2017-06-13 HISTORY — DX: Unspecified osteoarthritis, unspecified site: M19.90

## 2017-06-13 LAB — CBC
HCT: 43.1 % (ref 39.0–52.0)
Hemoglobin: 14.9 g/dL (ref 13.0–17.0)
MCH: 33.1 pg (ref 26.0–34.0)
MCHC: 34.6 g/dL (ref 30.0–36.0)
MCV: 95.8 fL (ref 78.0–100.0)
Platelets: 298 10*3/uL (ref 150–400)
RBC: 4.5 MIL/uL (ref 4.22–5.81)
RDW: 13.7 % (ref 11.5–15.5)
WBC: 7.9 10*3/uL (ref 4.0–10.5)

## 2017-06-13 LAB — SURGICAL PCR SCREEN
MRSA, PCR: NEGATIVE
Staphylococcus aureus: NEGATIVE

## 2017-06-13 LAB — ABO/RH: ABO/RH(D): O POS

## 2017-06-14 ENCOUNTER — Encounter: Payer: Self-pay | Admitting: Infectious Diseases

## 2017-06-14 ENCOUNTER — Ambulatory Visit: Payer: 59 | Admitting: Infectious Diseases

## 2017-06-14 DIAGNOSIS — B20 Human immunodeficiency virus [HIV] disease: Secondary | ICD-10-CM | POA: Diagnosis not present

## 2017-06-14 DIAGNOSIS — F32A Depression, unspecified: Secondary | ICD-10-CM

## 2017-06-14 DIAGNOSIS — M8700 Idiopathic aseptic necrosis of unspecified bone: Secondary | ICD-10-CM | POA: Diagnosis not present

## 2017-06-14 DIAGNOSIS — E785 Hyperlipidemia, unspecified: Secondary | ICD-10-CM | POA: Diagnosis not present

## 2017-06-14 DIAGNOSIS — F329 Major depressive disorder, single episode, unspecified: Secondary | ICD-10-CM | POA: Diagnosis not present

## 2017-06-14 NOTE — Assessment & Plan Note (Addendum)
Will continue on triumeq His CD4 963 (04-2017) has done at work. His RNA was only done as detected.  Will await further testing.  His vax are up to date.  Husband+, und, genvoya.  tdap next visit Colon appt, derm appt next visit.  Will see him back in 4 months.

## 2017-06-14 NOTE — Assessment & Plan Note (Signed)
continues on lipitor.  Normal LFTs at last blood draw.  No myalgias.

## 2017-06-14 NOTE — Progress Notes (Signed)
   Subjective:    Patient ID: Jason Townsend, male    DOB: 30-Jun-1960, 57 y.o.   MRN: [id number]  HPI 57 yo M with hx of HIV+ (dx 08-2010, currently on triumeq 07-24-13), initially on truvada/darunavir-norvir/intelence, HLA-, prev K103N), previously followed at [facility].  He also has a hx of depression and sees a mental health provider. Prev partner died from suicide 25-Jul-2015.  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 Marland Kitchen  He is to have L THR 06-23-17.  Got married 03-2017. Moving to [place]. Helping move his mother as well as selling another house.  No problems with ART. Rarely forgets to take. Can count on 1 hand number of missed.  Worried about decreased libido and statin. No muscle aches.   HIV 1 RNA Quant (Copies/mL)  Date Value  02/03/2017 37 (H)   CD4 T Cell Abs (/uL)  Date Value  02/03/2017 880    Review of Systems  Constitutional: Negative for appetite change and unexpected weight change.  Gastrointestinal: Negative for constipation and diarrhea.  Genitourinary: Negative for difficulty urinating.  Musculoskeletal: Positive for arthralgias. Negative for myalgias.  has not been able to exercise due to hip.  Please see HPI. All other systems reviewed and negative.     Objective:   Physical Exam  Constitutional: He is oriented to person, place, and time. He appears well-developed and well-nourished.  HENT:  Mouth/Throat: No oropharyngeal exudate.  Eyes: Pupils are equal, round, and reactive to light. EOM are normal.  Neck: Normal range of motion. Neck supple.  Cardiovascular: Normal rate, regular rhythm and normal heart sounds.  Pulmonary/Chest: Effort normal and breath sounds normal.  Abdominal: Soft. Bowel sounds are normal. There is no tenderness. There is no guarding.  Neurological: He is alert and oriented to person, place, and time.  Skin:           Assessment & Plan:

## 2017-06-14 NOTE — Assessment & Plan Note (Signed)
For L THR on 06-23-17.

## 2017-06-14 NOTE — Assessment & Plan Note (Signed)
mood is good.

## 2017-06-15 ENCOUNTER — Other Ambulatory Visit (INDEPENDENT_AMBULATORY_CARE_PROVIDER_SITE_OTHER): Payer: Self-pay

## 2017-06-15 ENCOUNTER — Telehealth (INDEPENDENT_AMBULATORY_CARE_PROVIDER_SITE_OTHER): Payer: Self-pay | Admitting: Orthopaedic Surgery

## 2017-06-15 NOTE — Telephone Encounter (Signed)
Patient aware to stop five days prior

## 2017-06-15 NOTE — Telephone Encounter (Signed)
Patient left vm stating he has surgery 5/31 and he needs to know when to stop taking the non steroidal antiinflammatory drugs (225 mg generic diclofenac) and also mulitvitamins - preop told him he would need to stop taking them sometime before surgery but did not tell him when to stop. Please advise # [phone]

## 2017-06-22 MED ORDER — TRANEXAMIC ACID 1000 MG/10ML IV SOLN
1000.0000 mg | INTRAVENOUS | Status: AC
  Administered 2017-06-23: 1000 mg via INTRAVENOUS
  Filled 2017-06-22: qty 10

## 2017-06-23 ENCOUNTER — Inpatient Hospital Stay (HOSPITAL_COMMUNITY)
Admission: RE | Admit: 2017-06-23 | Discharge: 2017-06-24 | DRG: 470 | Disposition: A | Discharge status: Home Health | Payer: 59 | Source: Ambulatory Visit | Attending: Orthopaedic Surgery | Admitting: Orthopaedic Surgery

## 2017-06-23 ENCOUNTER — Inpatient Hospital Stay (HOSPITAL_COMMUNITY): Payer: 59 | Admitting: Certified Registered Nurse Anesthetist

## 2017-06-23 ENCOUNTER — Inpatient Hospital Stay (HOSPITAL_COMMUNITY): Payer: 59

## 2017-06-23 ENCOUNTER — Other Ambulatory Visit: Payer: Self-pay

## 2017-06-23 ENCOUNTER — Encounter (HOSPITAL_COMMUNITY)
Admission: RE | Disposition: A | Discharge status: Home Health | Payer: Self-pay | Source: Ambulatory Visit | Attending: Orthopaedic Surgery

## 2017-06-23 ENCOUNTER — Encounter (HOSPITAL_COMMUNITY): Payer: Self-pay | Admitting: Certified Registered Nurse Anesthetist

## 2017-06-23 DIAGNOSIS — M1612 Unilateral primary osteoarthritis, left hip: Principal | ICD-10-CM | POA: Diagnosis present

## 2017-06-23 DIAGNOSIS — F1511 Other stimulant abuse, in remission: Secondary | ICD-10-CM | POA: Diagnosis present

## 2017-06-23 DIAGNOSIS — F17210 Nicotine dependence, cigarettes, uncomplicated: Secondary | ICD-10-CM | POA: Diagnosis present

## 2017-06-23 DIAGNOSIS — F329 Major depressive disorder, single episode, unspecified: Secondary | ICD-10-CM | POA: Diagnosis present

## 2017-06-23 DIAGNOSIS — Z9842 Cataract extraction status, left eye: Secondary | ICD-10-CM

## 2017-06-23 DIAGNOSIS — B20 Human immunodeficiency virus [HIV] disease: Secondary | ICD-10-CM | POA: Diagnosis present

## 2017-06-23 DIAGNOSIS — Z96642 Presence of left artificial hip joint: Secondary | ICD-10-CM

## 2017-06-23 DIAGNOSIS — Z419 Encounter for procedure for purposes other than remedying health state, unspecified: Secondary | ICD-10-CM

## 2017-06-23 DIAGNOSIS — E785 Hyperlipidemia, unspecified: Secondary | ICD-10-CM | POA: Diagnosis present

## 2017-06-23 HISTORY — PX: TOTAL HIP ARTHROPLASTY: SHX124

## 2017-06-23 LAB — TYPE AND SCREEN
ABO/RH(D): O POS
Antibody Screen: NEGATIVE

## 2017-06-23 SURGERY — ARTHROPLASTY, HIP, TOTAL, ANTERIOR APPROACH
Anesthesia: Monitor Anesthesia Care | Site: Hip | Laterality: Left

## 2017-06-23 MED ORDER — CEFAZOLIN SODIUM-DEXTROSE 1-4 GM/50ML-% IV SOLN
1.0000 g | Freq: Four times a day (QID) | INTRAVENOUS | Status: AC
  Administered 2017-06-23 (×2): 1 g via INTRAVENOUS
  Filled 2017-06-23 (×2): qty 50

## 2017-06-23 MED ORDER — PHENOL 1.4 % MT LIQD: 1.0000 | OROMUCOSAL | Status: DC | PRN

## 2017-06-23 MED ORDER — ASPIRIN 81 MG PO CHEW
81.0000 mg | CHEWABLE_TABLET | Freq: Two times a day (BID) | ORAL | Status: DC
  Administered 2017-06-23 – 2017-06-24 (×2): 81 mg via ORAL
  Filled 2017-06-23 (×2): qty 1

## 2017-06-23 MED ORDER — EPHEDRINE SULFATE-NACL 50-0.9 MG/10ML-% IV SOSY
PREFILLED_SYRINGE | INTRAVENOUS | Status: DC | PRN
  Administered 2017-06-23 (×3): 10 mg via INTRAVENOUS

## 2017-06-23 MED ORDER — ACETAMINOPHEN 325 MG PO TABS: 325.0000 mg | ORAL_TABLET | Freq: Four times a day (QID) | ORAL | Status: DC | PRN

## 2017-06-23 MED ORDER — SODIUM CHLORIDE 0.9 % IR SOLN
Status: DC | PRN
  Administered 2017-06-23: 1000 mL

## 2017-06-23 MED ORDER — CEFAZOLIN SODIUM-DEXTROSE 2-4 GM/100ML-% IV SOLN
2.0000 g | INTRAVENOUS | Status: AC
  Administered 2017-06-23: 2 g via INTRAVENOUS
  Filled 2017-06-23: qty 100

## 2017-06-23 MED ORDER — CHLORHEXIDINE GLUCONATE 4 % EX LIQD: 60.0000 mL | Freq: Once | CUTANEOUS | Status: DC

## 2017-06-23 MED ORDER — DOCUSATE SODIUM 100 MG PO CAPS
100.0000 mg | ORAL_CAPSULE | Freq: Two times a day (BID) | ORAL | Status: DC
  Administered 2017-06-23 – 2017-06-24 (×2): 100 mg via ORAL
  Filled 2017-06-23 (×2): qty 1

## 2017-06-23 MED ORDER — LORATADINE 10 MG PO TABS
10.0000 mg | ORAL_TABLET | Freq: Every day | ORAL | Status: DC
  Administered 2017-06-24: 10 mg via ORAL
  Filled 2017-06-23: qty 1

## 2017-06-23 MED ORDER — LACTATED RINGERS IV SOLN
INTRAVENOUS | Status: DC
  Administered 2017-06-23 (×3): via INTRAVENOUS

## 2017-06-23 MED ORDER — ONDANSETRON HCL 4 MG/2ML IJ SOLN: 4.0000 mg | Freq: Four times a day (QID) | INTRAMUSCULAR | Status: DC | PRN

## 2017-06-23 MED ORDER — METOCLOPRAMIDE HCL 5 MG PO TABS: 5.0000 mg | ORAL_TABLET | Freq: Three times a day (TID) | ORAL | Status: DC | PRN

## 2017-06-23 MED ORDER — DEXAMETHASONE SODIUM PHOSPHATE 10 MG/ML IJ SOLN
INTRAMUSCULAR | Status: AC
  Filled 2017-06-23: qty 1

## 2017-06-23 MED ORDER — PHENYLEPHRINE 40 MCG/ML (10ML) SYRINGE FOR IV PUSH (FOR BLOOD PRESSURE SUPPORT)
PREFILLED_SYRINGE | INTRAVENOUS | Status: AC
  Filled 2017-06-23: qty 10

## 2017-06-23 MED ORDER — EPHEDRINE SULFATE 50 MG/ML IJ SOLN
INTRAMUSCULAR | Status: AC
Start: 2017-06-23 — End: ?
  Filled 2017-06-23: qty 1

## 2017-06-23 MED ORDER — SODIUM CHLORIDE 0.9 % IV SOLN
INTRAVENOUS | Status: DC
  Administered 2017-06-23: 18:00:00 via INTRAVENOUS

## 2017-06-23 MED ORDER — POLYETHYLENE GLYCOL 3350 17 G PO PACK: 17.0000 g | PACK | Freq: Every day | ORAL | Status: DC | PRN

## 2017-06-23 MED ORDER — ONDANSETRON HCL 4 MG PO TABS: 4.0000 mg | ORAL_TABLET | Freq: Four times a day (QID) | ORAL | Status: DC | PRN

## 2017-06-23 MED ORDER — ALUM & MAG HYDROXIDE-SIMETH 200-200-20 MG/5ML PO SUSP
30.0000 mL | ORAL | Status: DC | PRN
Start: 2017-06-23 — End: 2017-06-24

## 2017-06-23 MED ORDER — STERILE WATER FOR IRRIGATION IR SOLN
Status: DC | PRN
  Administered 2017-06-23: 2000 mL

## 2017-06-23 MED ORDER — GABAPENTIN 100 MG PO CAPS
100.0000 mg | ORAL_CAPSULE | Freq: Three times a day (TID) | ORAL | Status: DC
  Administered 2017-06-23 – 2017-06-24 (×3): 100 mg via ORAL
  Filled 2017-06-23 (×3): qty 1

## 2017-06-23 MED ORDER — ATORVASTATIN CALCIUM 10 MG PO TABS
10.0000 mg | ORAL_TABLET | Freq: Every day | ORAL | Status: DC
  Administered 2017-06-24: 10 mg via ORAL
  Filled 2017-06-23: qty 1

## 2017-06-23 MED ORDER — OXYCODONE HCL 5 MG PO TABS: 5.0000 mg | ORAL_TABLET | Freq: Once | ORAL | Status: DC | PRN

## 2017-06-23 MED ORDER — FENTANYL CITRATE (PF) 100 MCG/2ML IJ SOLN
INTRAMUSCULAR | Status: DC | PRN
  Administered 2017-06-23 (×2): 50 ug via INTRAVENOUS

## 2017-06-23 MED ORDER — DEXAMETHASONE SODIUM PHOSPHATE 10 MG/ML IJ SOLN
INTRAMUSCULAR | Status: DC | PRN
  Administered 2017-06-23: 10 mg via INTRAVENOUS

## 2017-06-23 MED ORDER — FENTANYL CITRATE (PF) 100 MCG/2ML IJ SOLN
INTRAMUSCULAR | Status: AC
  Filled 2017-06-23: qty 2

## 2017-06-23 MED ORDER — ONDANSETRON HCL 4 MG/2ML IJ SOLN
INTRAMUSCULAR | Status: DC | PRN
  Administered 2017-06-23: 4 mg via INTRAVENOUS

## 2017-06-23 MED ORDER — PROPOFOL 10 MG/ML IV BOLUS
INTRAVENOUS | Status: AC
  Filled 2017-06-23: qty 20

## 2017-06-23 MED ORDER — MENTHOL 3 MG MT LOZG: 1.0000 | LOZENGE | OROMUCOSAL | Status: DC | PRN

## 2017-06-23 MED ORDER — PANTOPRAZOLE SODIUM 40 MG PO TBEC
40.0000 mg | DELAYED_RELEASE_TABLET | Freq: Every day | ORAL | Status: DC
  Administered 2017-06-23 – 2017-06-24 (×2): 40 mg via ORAL
  Filled 2017-06-23 (×2): qty 1

## 2017-06-23 MED ORDER — PROPOFOL 500 MG/50ML IV EMUL
INTRAVENOUS | Status: DC | PRN
  Administered 2017-06-23: 100 ug/kg/min via INTRAVENOUS

## 2017-06-23 MED ORDER — PROPOFOL 10 MG/ML IV BOLUS
INTRAVENOUS | Status: AC
  Filled 2017-06-23: qty 60

## 2017-06-23 MED ORDER — METHOCARBAMOL 500 MG PO TABS
500.0000 mg | ORAL_TABLET | Freq: Four times a day (QID) | ORAL | Status: DC | PRN
  Administered 2017-06-23 – 2017-06-24 (×2): 500 mg via ORAL
  Filled 2017-06-23 (×2): qty 1

## 2017-06-23 MED ORDER — MIDAZOLAM HCL 5 MG/5ML IJ SOLN
INTRAMUSCULAR | Status: DC | PRN
  Administered 2017-06-23: 2 mg via INTRAVENOUS

## 2017-06-23 MED ORDER — OXYCODONE HCL 5 MG/5ML PO SOLN
5.0000 mg | Freq: Once | ORAL | Status: DC | PRN
  Filled 2017-06-23: qty 5

## 2017-06-23 MED ORDER — METOCLOPRAMIDE HCL 5 MG/ML IJ SOLN: 5.0000 mg | Freq: Three times a day (TID) | INTRAMUSCULAR | Status: DC | PRN

## 2017-06-23 MED ORDER — ABACAVIR-DOLUTEGRAVIR-LAMIVUD 600-50-300 MG PO TABS
1.0000 | ORAL_TABLET | Freq: Every day | ORAL | Status: DC
  Administered 2017-06-24: 1 via ORAL
  Filled 2017-06-23: qty 1

## 2017-06-23 MED ORDER — ZOLPIDEM TARTRATE 5 MG PO TABS: 5.0000 mg | ORAL_TABLET | Freq: Every evening | ORAL | Status: DC | PRN

## 2017-06-23 MED ORDER — ADULT MULTIVITAMIN W/MINERALS CH
1.0000 | ORAL_TABLET | Freq: Every day | ORAL | Status: DC
  Administered 2017-06-24: 1 via ORAL
  Filled 2017-06-23: qty 1

## 2017-06-23 MED ORDER — BUPIVACAINE HCL (PF) 0.75 % IJ SOLN
INTRAMUSCULAR | Status: DC | PRN
  Administered 2017-06-23: 2 mL via INTRATHECAL

## 2017-06-23 MED ORDER — METHOCARBAMOL 1000 MG/10ML IJ SOLN
500.0000 mg | Freq: Four times a day (QID) | INTRAVENOUS | Status: DC | PRN
  Administered 2017-06-23: 500 mg via INTRAVENOUS
  Filled 2017-06-23: qty 550

## 2017-06-23 MED ORDER — PHENYLEPHRINE 40 MCG/ML (10ML) SYRINGE FOR IV PUSH (FOR BLOOD PRESSURE SUPPORT)
PREFILLED_SYRINGE | INTRAVENOUS | Status: DC | PRN
  Administered 2017-06-23 (×5): 80 ug via INTRAVENOUS

## 2017-06-23 MED ORDER — OXYCODONE HCL 5 MG PO TABS
10.0000 mg | ORAL_TABLET | ORAL | Status: DC | PRN
  Administered 2017-06-23 – 2017-06-24 (×3): 10 mg via ORAL
  Filled 2017-06-23 (×3): qty 2

## 2017-06-23 MED ORDER — MIDAZOLAM HCL 2 MG/2ML IJ SOLN
INTRAMUSCULAR | Status: AC
  Filled 2017-06-23: qty 2

## 2017-06-23 MED ORDER — FENTANYL CITRATE (PF) 100 MCG/2ML IJ SOLN: 25.0000 ug | INTRAMUSCULAR | Status: DC | PRN

## 2017-06-23 MED ORDER — DIPHENHYDRAMINE HCL 12.5 MG/5ML PO ELIX: 12.5000 mg | ORAL_SOLUTION | ORAL | Status: DC | PRN

## 2017-06-23 MED ORDER — HYDROMORPHONE HCL 1 MG/ML IJ SOLN: 0.5000 mg | INTRAMUSCULAR | Status: DC | PRN

## 2017-06-23 MED ORDER — OXYCODONE HCL 5 MG PO TABS
5.0000 mg | ORAL_TABLET | ORAL | Status: DC | PRN
  Administered 2017-06-23: 5 mg via ORAL
  Administered 2017-06-24: 10 mg via ORAL
  Filled 2017-06-23 (×3): qty 1

## 2017-06-23 MED ORDER — ONDANSETRON HCL 4 MG/2ML IJ SOLN
INTRAMUSCULAR | Status: AC
  Filled 2017-06-23: qty 2

## 2017-06-23 SURGICAL SUPPLY — 35 items
BAG ZIPLOCK 12X15 (MISCELLANEOUS) IMPLANT
BENZOIN TINCTURE PRP APPL 2/3 (GAUZE/BANDAGES/DRESSINGS) ×2 IMPLANT
BLADE SAW SGTL 18X1.27X75 (BLADE) ×2 IMPLANT
CAPT HIP TOTAL 2 ×2 IMPLANT
COVER PERINEAL POST (MISCELLANEOUS) ×2 IMPLANT
COVER SURGICAL LIGHT HANDLE (MISCELLANEOUS) ×2 IMPLANT
DRAPE STERI IOBAN 125X83 (DRAPES) ×2 IMPLANT
DRAPE U-SHAPE 47X51 STRL (DRAPES) ×4 IMPLANT
DRSG AQUACEL AG ADV 3.5X10 (GAUZE/BANDAGES/DRESSINGS) ×2 IMPLANT
DURAPREP 26ML APPLICATOR (WOUND CARE) ×2 IMPLANT
ELECT REM PT RETURN 15FT ADLT (MISCELLANEOUS) ×2 IMPLANT
GAUZE XEROFORM 1X8 LF (GAUZE/BANDAGES/DRESSINGS) IMPLANT
GLOVE BIO SURGEON STRL SZ7.5 (GLOVE) ×2 IMPLANT
GLOVE BIOGEL PI IND STRL 7.0 (GLOVE) ×4 IMPLANT
GLOVE BIOGEL PI IND STRL 8 (GLOVE) ×2 IMPLANT
GLOVE BIOGEL PI INDICATOR 7.0 (GLOVE) ×4
GLOVE BIOGEL PI INDICATOR 8 (GLOVE) ×2
GLOVE ECLIPSE 8.0 STRL XLNG CF (GLOVE) ×2 IMPLANT
GOWN STRL REUS W/TWL XL LVL3 (GOWN DISPOSABLE) ×8 IMPLANT
HANDPIECE INTERPULSE COAX TIP (DISPOSABLE) ×1
HEAD M SROM 36MM 2 (Hips) ×1 IMPLANT
HOLDER FOLEY CATH W/STRAP (MISCELLANEOUS) ×2 IMPLANT
PACK ANTERIOR HIP CUSTOM (KITS) ×2 IMPLANT
SET HNDPC FAN SPRY TIP SCT (DISPOSABLE) ×1 IMPLANT
SROM M HEAD 36MM 2 (Hips) ×2 IMPLANT
STAPLER VISISTAT 35W (STAPLE) IMPLANT
STRIP CLOSURE SKIN 1/2X4 (GAUZE/BANDAGES/DRESSINGS) ×2 IMPLANT
SUT ETHIBOND NAB CT1 #1 30IN (SUTURE) ×2 IMPLANT
SUT MNCRL AB 4-0 PS2 18 (SUTURE) IMPLANT
SUT VIC AB 0 CT1 36 (SUTURE) ×2 IMPLANT
SUT VIC AB 1 CT1 36 (SUTURE) ×2 IMPLANT
SUT VIC AB 2-0 CT1 27 (SUTURE) ×2
SUT VIC AB 2-0 CT1 TAPERPNT 27 (SUTURE) ×2 IMPLANT
TRAY FOLEY MTR SLVR 16FR STAT (SET/KITS/TRAYS/PACK) ×2 IMPLANT
YANKAUER SUCT BULB TIP 10FT TU (MISCELLANEOUS) ×2 IMPLANT

## 2017-06-23 NOTE — Op Note (Signed)
NAME: Jason Townsend, Jason Townsend [facility] MEDICAL RECORD [id number] ACCOUNT [id number] DATE OF BIRTH:06-Mar-1960 FACILITY: [facility] LOCATION: [facility]-PERIOP PHYSICIAN:Anoop Kerry Fort, MD  OPERATIVE REPORT  DATE OF PROCEDURE:  06/23/2017  DATE OF OPERATION:  06/23/2017  PREOPERATIVE DIAGNOSIS:  Severe osteoarthritis and degenerative joint disease, left hip.  POSTOPERATIVE DIAGNOSIS:  Severe osteoarthritis and degenerative joint disease, left hip.  PROCEDURE:  Left total hip arthroplasty through a direct anterior approach.  IMPLANTS:  DePuy Sector Gription acetabular component size 54, size 36+0 neutral polyethylene liner, size 6 high offset Actis femoral component, size 36+1.5 ceramic hip ball.  SURGEON:  Delia Sitar. Ninfa Linden, MD  ASSISTANT:  Benita Stabile, PA-C   ANESTHESIA:  Spinal.   ANTIBIOTICS:  2 g IV Ancef.  ESTIMATED BLOOD LOSS:  250 mL.  COMPLICATIONS:  None.  INDICATIONS:  The patient is a 57 year old gentleman with debilitating arthritis involving his left hip.  He is HIV positive, and it was felt that this was avascular necrosis given the high incidence in this population.  However, he has been hurting  since 2012, and his x-rays from my standpoint showed that this is more osteoarthritis and not avascular necrosis.  At this point, his pain has become daily, and it is detrimentally affecting his activities of daily living, his quality of life, and  mobility.  He walks with a significant limp, and he does wish to proceed with a total hip arthroplasty.  He has tried and failed all forms of conservative treatment.  At this point, we will proceed with that surgery today.  He understands fully his risk  of acute blood loss anemia, neurovascular injury, fracture, infection, dislocation, and DVT.  He understands the goals are to decrease pain and improve mobility and overall improve quality of life.  DESCRIPTION OF PROCEDURE:  After informed consent was obtained, the left hip was  marked.  He was brought to the operating room where spinal anesthesia was obtained while he was on a stretcher.  He was laid in the supine position on the stretcher.  A  Foley catheter was placed, and both feet had traction boots applied to them.  Next, he was placed supine on the Hana fracture table with a perineal post in place and both legs in an in-line skeletal traction device with no traction applied.  His left  operative hip was prepped and draped with DuraPrep and sterile drapes.  A time-out was called identifying the correct patient and correct left hip.  We then made an incision just inferior and posterior to the anterior iliac spine and carried this  obliquely down the leg.  We dissected down to the tensor fascia lata, and tensor fascia was then divided longitudinally to proceed with a standard approach to the hip.  We identified and cauterized the circumflex vessels.  We identified the hip capsule,  opened the hip capsule in an L-type format, finding a moderate joint effusion and significant periarticular osteoarthritis around the femoral head and neck region.  We placed Cobra retractors around the medial and lateral femoral neck and then made our  femoral neck cut with an oscillating saw just proximal to the lesser trochanter and completed this with an osteotome.  We placed a corkscrew down the femoral head and removed the femoral head in its entirety.  We found a wide area devoid of cartilage and  no evidence of avascular necrosis, but this was definitely osteoarthritis.  We then cleaned the acetabulum remnants and acetabular labrum and other debris.  I placed a bent Deatra Canter  in the mid acetabular rim and then began reaming in stepwise increments  from a size 43 reamer up to a size 53.  With the 53 reamer, we placed this under direct fluoroscopy as well so we could obtain our depth of reaming, our inclination and anteversion.  We then placed the real DePuy Sector Gription acetabular cup size 54   without any difficulty and a 36+0 acetabular liner for a size 54 acetabular component.  We then turned attention to the femur.  With the leg externally rotated to 120 degrees, extended and adducted, we placed a Mueller retractor medially and a Hohmann  retractor behind the greater trochanter.  We released the lateral joint capsule and used a box-cutting osteotome in the femoral canal and a rongeur to lateralize.  We then began broaching using the Actis broaching system from a size 1 up to a size 6.   With a size 6 in place, we tried a high-offset femoral neck and a 36 -2 hip ball and reduced this in the acetabulum, and we felt like this gave [place] the correct leg length.  We then removed all instrumentation and placed the real Actis high-offset size 6  femoral component and a 36 -2 metal hip ball and reduced this in the acetabulum, but I did not feel like it was stable enough.  We dislocated the hip and removed that ball and went with the real 36+1.5 ceramic hip ball, reduced this in the acetabulum,  and I felt it was definitely much more stable.  We then irrigated the soft tissues with normal saline solution as well as the lavage.  We closed the joint capsule with interrupted #1 Ethibond suture, followed by running 0 Vicryl tensor fascia, 0 Vicryl  in the deep tissue, 2-0 Vicryl subcutaneous tissue, 4-0 Monocryl subcuticular stitches and Steri-Strips on the skin.  An Aquacel dressing was applied.  He was taken off the Hana table and taken to recovery room in stable condition.  All final counts were  correct.  There were no complications noted.  Note Benita Stabile, PA-C, assisted the entire case.  His assistance was crucial for facilitating all aspects of this case.  LN/NUANCE  D:06/23/2017 T:06/23/2017 JOB:[id number]

## 2017-06-23 NOTE — Anesthesia Postprocedure Evaluation (Signed)
Anesthesia Post Note  Patient: Jason Townsend  Procedure(s) Performed: LEFT TOTAL HIP ARTHROPLASTY ANTERIOR APPROACH (Left Hip)     Patient location during evaluation: PACU Anesthesia Type: MAC and Spinal Level of consciousness: oriented and awake and alert Pain management: pain level controlled Vital Signs Assessment: post-procedure vital signs reviewed and stable Respiratory status: spontaneous breathing, respiratory function stable and patient connected to nasal cannula oxygen Cardiovascular status: blood pressure returned to baseline and stable Postop Assessment: no headache, no backache and no apparent nausea or vomiting Anesthetic complications: no    Last Vitals:  Vitals:   06/23/17 1400 06/23/17 1422  BP: 108/67 115/75  Pulse: (!) 59 64  Resp: 15 16  Temp:  (!) 36.3 C  SpO2: 100% 98%    Last Pain:  Vitals:   06/23/17 1422  TempSrc:   PainSc: 0-No pain                 Benuel Ly S

## 2017-06-23 NOTE — H&P (Signed)
TOTAL HIP ADMISSION H&P  Patient is admitted for left total hip arthroplasty.  Subjective:  Chief Complaint: left hip pain  HPI: Jason Townsend, 57 y.o. male, has a history of pain and functional disability in the left hip(s) due to arthritis and patient has failed non-surgical conservative treatments for greater than 12 weeks to include NSAID's and/or analgesics, corticosteriod injections, flexibility and strengthening excercises and activity modification.  Onset of symptoms was gradual starting 9 years ago with gradually worsening course since that time.The patient noted no past surgery on the left hip(s).  Patient currently rates pain in the left hip at 10 out of 10 with activity. Patient has night pain, worsening of pain with activity and weight bearing, pain that interfers with activities of daily living, pain with passive range of motion and crepitus. Patient has evidence of subchondral cysts, subchondral sclerosis, periarticular osteophytes and joint space narrowing by imaging studies. This condition presents safety issues increasing the risk of falls.  There is no current active infection.  Patient Active Problem List   Diagnosis Date Noted  . Unilateral primary osteoarthritis, left hip 05/15/2017  . HIV disease ([place]) 02/22/2017  . Hyperlipidemia 02/22/2017  . AVN (avascular necrosis of bone) ([place]) 02/22/2017  . Syphilis 02/22/2017  . Depression (em[place]) 02/22/2017  . Hepatitis A immune 02/08/2017  . Hepatitis B immune 02/06/2017   Past Medical History:  Diagnosis Date  . Arthritis    oa left hip  . AVN (avascular necrosis of bone) ([facility])    L hip  . Depression   . HIV (human immunodeficiency virus infection) ([place]) 08/2010   HLA (-), previous K103N  . Hyperlipidemia   . 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 Syphilis    treated    Past Surgical History:  Procedure Laterality Date  . CATARACT EXTRACTION Left   . wisdom teeth extraction  1982     Current Facility-Administered Medications  Medication Dose Route Frequency Provider Last Rate Last Dose  . ceFAZolin (ANCEF) IVPB 2g/100 mL premix  2 g Intravenous On Call to OR Pete Pelt, PA-C      . chlorhexidine (HIBICLENS) 4 % liquid 4 application  60 mL Topical Once Erskine Emery W, PA-C      . tranexamic acid (CYKLOKAPRON) 1,000 mg in sodium chloride 0.9 % 100 mL IVPB  1,000 mg Intravenous On Call to OR Mcarthur Rossetti, MD       No Known Allergies  Social History   Tobacco Use  . Smoking status: Current Every Day Smoker    Packs/day: 0.50    Years: 20.00    Pack years: 10.00    Types: Cigarettes  . Smokeless tobacco: Never Used  . Tobacco comment: talking about it   Substance Use Topics  . Alcohol use: No    Frequency: Never    Family History  Problem Relation Age of Onset  . Dementia Father      Review of Systems  Musculoskeletal: Positive for joint pain.  All other systems reviewed and are negative.   Objective:  Physical Exam  Constitutional: He is oriented to person, place, and time. He appears well-developed and well-nourished.  HENT:  Head: Normocephalic and atraumatic.  Eyes: Pupils are equal, round, and reactive to light. EOM are normal.  Neck: Normal range of motion. Neck supple.  Cardiovascular: Normal rate and regular rhythm.  Respiratory: Effort normal and breath sounds normal.  GI: Soft. Bowel sounds are normal.  Musculoskeletal:  Left hip: He exhibits decreased range of motion, decreased strength, tenderness and bony tenderness.  Neurological: He is alert and oriented to person, place, and time.  Skin: Skin is warm and dry.  Psychiatric: He has a normal mood and affect.    Vital signs in last 24 hours: Temp:  [99.3 F (37.4 C)] 99.3 F (37.4 C) (05/31 0943) Pulse Rate:  [64] 64 (05/31 0943) Resp:  [18] 18 (05/31 0943) BP: (136)/(61) 136/61 (05/31 0943) SpO2:  [98 %] 98 % (05/31 0943)  Labs:   Estimated body mass  index is 26.37 kg/m as calculated from the following:   Height as of 06/14/17: 6' 2"  (1.88 m).   Weight as of 06/14/17: 205 lb 6.4 oz (93.2 kg).   Imaging Review Plain radiographs demonstrate severe degenerative joint disease of the left hip(s). The bone quality appears to be excellent for age and reported activity level.    Preoperative templating of the joint replacement has been completed, documented, and submitted to the Operating Room personnel in order to optimize intra-operative equipment management.     Assessment/Plan:  End stage arthritis, left hip(s)  The patient history, physical examination, clinical judgement of the provider and imaging studies are consistent with end stage degenerative joint disease of the left hip(s) and total hip arthroplasty is deemed medically necessary. The treatment options including medical management, injection therapy, arthroscopy and arthroplasty were discussed at length. The risks and benefits of total hip arthroplasty were presented and reviewed. The risks due to aseptic loosening, infection, stiffness, dislocation/subluxation,  thromboembolic complications and other imponderables were discussed.  The patient acknowledged the explanation, agreed to proceed with the plan and consent was signed. Patient is being admitted for inpatient treatment for surgery, pain control, PT, OT, prophylactic antibiotics, VTE prophylaxis, progressive ambulation and ADL's and discharge planning.The patient is planning to be discharged home with home health services

## 2017-06-23 NOTE — Anesthesia Procedure Notes (Signed)
Spinal  Patient location during procedure: OR Start time: 06/23/2017 11:34 AM End time: 06/23/2017 11:37 AM Staffing Anesthesiologist: Albertha Ghee, MD Performed: anesthesiologist  Preanesthetic Checklist Completed: patient identified, surgical consent, pre-op evaluation, timeout performed, IV checked, risks and benefits discussed and monitors and equipment checked Spinal Block Patient position: sitting Prep: DuraPrep Patient monitoring: cardiac monitor, continuous pulse ox and blood pressure Approach: midline Location: L3-4 Injection technique: single-shot Needle Needle type: Pencan  Needle gauge: 24 G Needle length: 9 cm Assessment Sensory level: T10 Additional Notes Functioning IV was confirmed and monitors were applied. Sterile prep and drape, including hand hygiene and sterile gloves were used. The patient was positioned and the spine was prepped. The skin was anesthetized with lidocaine.  Free flow of clear CSF was obtained prior to injecting local anesthetic into the CSF.  The spinal needle aspirated freely following injection.  The needle was carefully withdrawn.  The patient tolerated the procedure well.

## 2017-06-23 NOTE — Anesthesia Preprocedure Evaluation (Signed)
Anesthesia Evaluation  Patient identified by MRN, date of birth, ID band Patient awake    Reviewed: Allergy & Precautions, H&P , NPO status , Patient's Chart, lab work & pertinent test results  Airway Mallampati: II   Neck ROM: full    Dental   Pulmonary Current Smoker,    breath sounds clear to auscultation       Cardiovascular negative cardio ROS   Rhythm:regular Rate:Normal     Neuro/Psych PSYCHIATRIC DISORDERS Depression    GI/Hepatic (+)     substance abuse  methamphetamine use,   Endo/Other    Renal/GU      Musculoskeletal  (+) Arthritis ,   Abdominal   Peds  Hematology  (+) HIV,   Anesthesia Other Findings   Reproductive/Obstetrics                             Anesthesia Physical Anesthesia Plan  ASA: III  Anesthesia Plan: Spinal and MAC   Post-op Pain Management:    Induction: Intravenous  PONV Risk Score and Plan: 0 and Ondansetron, Dexamethasone, Propofol infusion, Midazolam and Treatment may vary due to age or medical condition  Airway Management Planned: Simple Face Mask  Additional Equipment:   Intra-op Plan:   Post-operative Plan:   Informed Consent: I have reviewed the patients History and Physical, chart, labs and discussed the procedure including the risks, benefits and alternatives for the proposed anesthesia with the patient or authorized representative who has indicated his/her understanding and acceptance.     Plan Discussed with: CRNA, Anesthesiologist and Surgeon  Anesthesia Plan Comments:         Anesthesia Quick Evaluation

## 2017-06-23 NOTE — Transfer of Care (Signed)
Immediate Anesthesia Transfer of Care Note  Patient: Jason Townsend  Procedure(s) Performed: LEFT TOTAL HIP ARTHROPLASTY ANTERIOR APPROACH (Left Hip)  Patient Location: PACU  Anesthesia Type:Spinal  Level of Consciousness: awake, alert , oriented and patient cooperative  Airway & Oxygen Therapy: Patient Spontanous Breathing  Post-op Assessment: Report given to RN and Post -op Vital signs reviewed and stable  Post vital signs: Reviewed and stable  Last Vitals:  Vitals Value Taken Time  BP 91/64 06/23/2017  1:20 PM  Temp    Pulse 71 06/23/2017  1:22 PM  Resp 17 06/23/2017  1:22 PM  SpO2 95 % 06/23/2017  1:22 PM  Vitals shown include unvalidated device data.  Last Pain:  Vitals:   06/23/17 0943  TempSrc: Oral  PainSc: 5       Patients Stated Pain Goal: 5 (09/47/09 6283)  Complications: No apparent anesthesia complications

## 2017-06-23 NOTE — Evaluation (Signed)
Physical Therapy Evaluation Patient Details Name: Jason Townsend MRN: [id number] DOB: [birth year] Today's Date: 06/23/2017   History of Present Illness  Pt s/p L THR and with hx of HIV  Clinical Impression  Pt s/p L THR and presents with decreased L LE strength/ROM and post op pain limiting functional mobility.  Pt should progress to dc home with family assist.    Follow Up Recommendations Follow surgeon's recommendation for DC plan and follow-up therapies    Equipment Recommendations  None recommended by PT    Recommendations for Other Services       Precautions / Restrictions Precautions Precautions: Fall Restrictions Weight Bearing Restrictions: No Other Position/Activity Restrictions: WBAT      Mobility  Bed Mobility Overal bed mobility: Needs Assistance Bed Mobility: Supine to Sit     Supine to sit: Min assist     General bed mobility comments: cues for sequence and use of R LE to self assist  Transfers Overall transfer level: Needs assistance Equipment used: Rolling walker (2 wheeled) Transfers: Sit to/from Stand Sit to Stand: Min assist         General transfer comment: cues for LE management and use of UEs to self assist  Ambulation/Gait Ambulation/Gait assistance: Min assist Ambulation Distance (Feet): 111 Feet Assistive device: Rolling walker (2 wheeled) Gait Pattern/deviations: Step-to pattern;Decreased step length - right;Decreased step length - left;Shuffle;Trunk flexed Gait velocity: decr   General Gait Details: cues for posture, position from RW and sequence  Stairs            Wheelchair Mobility    Modified Rankin (Stroke Patients Only)       Balance Overall balance assessment: No apparent balance deficits (not formally assessed)                                           Pertinent Vitals/Pain Pain Assessment: 0-10 Pain Score: 7  Pain Location: L hip Pain Descriptors / Indicators: Pressure Pain  Intervention(s): Limited activity within patient's tolerance;Monitored during session;Premedicated before session    Home Living Family/patient expects to be discharged to:: Private residence Living Arrangements: Spouse/significant other Available Help at Discharge: Family Type of Home: House Home Access: Stairs to enter Entrance Stairs-Rails: Right Entrance Stairs-Number of Steps: 2 Home Layout: One level Home Equipment: Environmental consultant - 2 wheels;Cane - single point      Prior Function Level of Independence: Independent               Hand Dominance        Extremity/Trunk Assessment   Upper Extremity Assessment Upper Extremity Assessment: Overall WFL for tasks assessed    Lower Extremity Assessment Lower Extremity Assessment: LLE deficits/detail    Cervical / Trunk Assessment Cervical / Trunk Assessment: Normal  Communication   Communication: No difficulties  Cognition Arousal/Alertness: Awake/alert Behavior During Therapy: WFL for tasks assessed/performed Overall Cognitive Status: Within Functional Limits for tasks assessed                                        General Comments      Exercises Total Joint Exercises Ankle Circles/Pumps: AROM;Both;15 reps;Supine   Assessment/Plan    PT Assessment Patient needs continued PT services  PT Problem List Decreased strength;Decreased range of motion;Decreased activity tolerance;Decreased mobility;Decreased knowledge of use of  DME;Pain       PT Treatment Interventions DME instruction;Gait training;Stair training;Functional mobility training;Therapeutic activities;Therapeutic exercise;Patient/family education    PT Goals (Current goals can be found in the Care Plan section)  Acute Rehab PT Goals Patient Stated Goal: Regain IND PT Goal Formulation: With patient Time For Goal Achievement: 06/30/17 Potential to Achieve Goals: Good    Frequency 7X/week   Barriers to discharge        Co-evaluation                AM-PAC PT "6 Clicks" Daily Activity  Outcome Measure Difficulty turning over in bed (including adjusting bedclothes, sheets and blankets)?: A Lot Difficulty moving from lying on back to sitting on the side of the bed? : A Lot Difficulty sitting down on and standing up from a chair with arms (e.g., wheelchair, bedside commode, etc,.)?: A Lot Help needed moving to and from a bed to chair (including a wheelchair)?: A Little Help needed walking in hospital room?: A Little Help needed climbing 3-5 steps with a railing? : A Little 6 Click Score: 15    End of Session Equipment Utilized During Treatment: Gait belt Activity Tolerance: Patient tolerated treatment well Patient left: in chair;with call bell/phone within reach Nurse Communication: Mobility status PT Visit Diagnosis: Difficulty in walking, not elsewhere classified (R26.2)    Time: 1710-1740 PT Time Calculation (min) (ACUTE ONLY): 30 min   Charges:     PT Treatments $Gait Training: 8-22 mins   PT G Codes:        Pg [phone]   Jason Townsend 06/23/2017, 6:14 PM

## 2017-06-23 NOTE — Brief Op Note (Signed)
06/23/2017  12:58 PM  PATIENT:  Jason Townsend  57 y.o. male  PRE-OPERATIVE DIAGNOSIS:  osteoarthritis left hip  POST-OPERATIVE DIAGNOSIS:  osteoarthritis left hip  PROCEDURE:  Procedure(s): LEFT TOTAL HIP ARTHROPLASTY ANTERIOR APPROACH (Left)  SURGEON:  Surgeon(s) and Role:    Mcarthur Rossetti, MD - Primary  PHYSICIAN ASSISTANT: Benita Stabile, PA-C  ANESTHESIA:   spinal  EBL:  250 mL   COUNTS:  YES  DICTATION: .Other Dictation: Dictation Number [id number]  PLAN OF CARE: Admit to inpatient   PATIENT DISPOSITION:  PACU - hemodynamically stable.   Delay start of Pharmacological VTE agent (>24hrs) due to surgical blood loss or risk of bleeding: no

## 2017-06-24 LAB — CBC
HCT: 37.4 % — ABNORMAL LOW (ref 39.0–52.0)
Hemoglobin: 12.6 g/dL — ABNORMAL LOW (ref 13.0–17.0)
MCH: 31.9 pg (ref 26.0–34.0)
MCHC: 33.7 g/dL (ref 30.0–36.0)
MCV: 94.7 fL (ref 78.0–100.0)
Platelets: 300 10*3/uL (ref 150–400)
RBC: 3.95 MIL/uL — ABNORMAL LOW (ref 4.22–5.81)
RDW: 13.7 % (ref 11.5–15.5)
WBC: 10.8 10*3/uL — ABNORMAL HIGH (ref 4.0–10.5)

## 2017-06-24 LAB — BASIC METABOLIC PANEL
Anion gap: 10 (ref 5–15)
BUN: 13 mg/dL (ref 6–20)
CO2: 24 mmol/L (ref 22–32)
Calcium: 9.1 mg/dL (ref 8.9–10.3)
Chloride: 103 mmol/L (ref 101–111)
Creatinine, Ser: 0.81 mg/dL (ref 0.61–1.24)
GFR calc Af Amer: >60 mL/min (ref >60)
GFR calc non Af Amer: >60 mL/min (ref >60)
Glucose, Bld: 114 mg/dL — ABNORMAL HIGH (ref 65–99)
Potassium: 4.8 mmol/L (ref 3.5–5.1)
Sodium: 137 mmol/L (ref 135–145)

## 2017-06-24 MED ORDER — METHOCARBAMOL 500 MG PO TABS: 500.0000 mg | ORAL_TABLET | Freq: Four times a day (QID) | ORAL | 0 refills | Status: DC | PRN

## 2017-06-24 MED ORDER — OXYCODONE HCL 5 MG PO TABS: 5.0000 mg | ORAL_TABLET | ORAL | 0 refills | Status: DC | PRN

## 2017-06-24 MED ORDER — ASPIRIN 81 MG PO CHEW: 81.0000 mg | CHEWABLE_TABLET | Freq: Two times a day (BID) | ORAL | 0 refills | Status: DC

## 2017-06-24 NOTE — Care Management Note (Signed)
Case Management Note  Patient Details  Name: Jason Townsend MRN: [id number] Date of Birth: 1960/08/23  Subjective/Objective:      S/p Left THR              Action/Plan: NCM spoke to pt ahd husband, Ronalee Belts at bedside. Offered choice for HH/list provided. Pt agreeable to [facility] (preoperatively arranged by surgeon's office) Pt reports having RW, cane and bars in his bathroom.    Expected Discharge Date:  06/24/17               Expected Discharge Plan:  [place]  In-House Referral:  NA  Discharge planning Services  CM Consult  Post Acute Care Choice:  Home Health Choice offered to:  Patient  DME Arranged:  N/A DME Agency:  NA  HH Arranged:  PT [place] Agency:  [facility] (formerly [employer])  Status of Service:  Completed, signed off  If discussed at [employer], dates discussed:    Additional Comments:  Erenest Rasher, RN 06/24/2017, 11:01 AM

## 2017-06-24 NOTE — Progress Notes (Signed)
Physical Therapy Treatment Patient Details Name: Jason Townsend MRN: [id number] DOB: [birth year] Today's Date: 06/24/2017    History of Present Illness Pt s/p direct anterior L THR and with hx of HIV    PT Comments    Pt progressing well with mobility and eager for return home.   Follow Up Recommendations  Follow surgeon's recommendation for DC plan and follow-up therapies     Equipment Recommendations  None recommended by PT    Recommendations for Other Services       Precautions / Restrictions Precautions Precautions: Fall Restrictions Weight Bearing Restrictions: No Other Position/Activity Restrictions: WBAT    Mobility  Bed Mobility Overal bed mobility: Modified Independent Bed Mobility: Supine to Sit;Sit to Supine     Supine to sit: Modified independent (Device/Increase time) Sit to supine: Min assist   General bed mobility comments: used RLE to A LLE off of bed  Transfers Overall transfer level: Needs assistance Equipment used: Rolling walker (2 wheeled) Transfers: Sit to/from Stand Sit to Stand: Supervision         General transfer comment: VCs for safe hand placement  Ambulation/Gait Ambulation/Gait assistance: Min assist Ambulation Distance (Feet): 220 Feet Assistive device: Rolling walker (2 wheeled) Gait Pattern/deviations: Step-to pattern;Decreased step length - right;Decreased step length - left;Shuffle;Trunk flexed Gait velocity: decr   General Gait Details: cues for posture, position from RW and sequence   Stairs             Wheelchair Mobility    Modified Rankin (Stroke Patients Only)       Balance Overall balance assessment: Needs assistance Sitting-balance support: Feet supported;No upper extremity supported Sitting balance-Leahy Scale: Good     Standing balance support: No upper extremity supported;During functional activity Standing balance-Leahy Scale: Fair                               Cognition Arousal/Alertness: Awake/alert Behavior During Therapy: WFL for tasks assessed/performed Overall Cognitive Status: Within Functional Limits for tasks assessed                                        Exercises Total Joint Exercises Ankle Circles/Pumps: AROM;Both;15 reps;Supine Quad Sets: AROM;Both;10 reps;Supine Heel Slides: AAROM;Left;20 reps;Supine Hip ABduction/ADduction: AAROM;Left;15 reps;Supine    General Comments        Pertinent Vitals/Pain Pain Assessment: 0-10 Pain Score: 8  Pain Location: L hip Pain Descriptors / Indicators: Sore Pain Intervention(s): Monitored during session;Premedicated before session;Limited activity within patient's tolerance;Ice applied    Home Living                      Prior Function            PT Goals (current goals can now be found in the care plan section) Acute Rehab PT Goals Patient Stated Goal: Home soon PT Goal Formulation: With patient Time For Goal Achievement: 06/30/17 Potential to Achieve Goals: Good Progress towards PT goals: Progressing toward goals    Frequency    7X/week      PT Plan Current plan remains appropriate    Co-evaluation              AM-PAC PT "6 Clicks" Daily Activity  Outcome Measure  Difficulty turning over in bed (including adjusting bedclothes, sheets and blankets)?: A Lot Difficulty moving from lying on  back to sitting on the side of the bed? : A Lot Difficulty sitting down on and standing up from a chair with arms (e.g., wheelchair, bedside commode, etc,.)?: A Lot Help needed moving to and from a bed to chair (including a wheelchair)?: A Little Help needed walking in hospital room?: A Little Help needed climbing 3-5 steps with a railing? : A Little 6 Click Score: 15    End of Session Equipment Utilized During Treatment: Gait belt Activity Tolerance: Patient tolerated treatment well Patient left: in chair;with call bell/phone within  reach Nurse Communication: Mobility status PT Visit Diagnosis: Difficulty in walking, not elsewhere classified (R26.2)     Time: 7510-2585 PT Time Calculation (min) (ACUTE ONLY): 29 min  Charges:  $Gait Training: 8-22 mins $Therapeutic Exercise: 8-22 mins                    G Codes:       Pg [phone]    Suleika Donavan 06/24/2017, 2:03 PM

## 2017-06-24 NOTE — Discharge Summary (Signed)
Patient ID: Jason Townsend MRN: [id number] DOB/AGE: 1960-12-21 57 y.o.  Admit date: 06/23/2017 Discharge date: 06/24/2017  Admission Diagnoses:  Principal Problem:   Unilateral primary osteoarthritis, left hip Active Problems:   Status post total replacement of left hip   Discharge Diagnoses:  Same  Past Medical History:  Diagnosis Date  . Arthritis    oa left hip  . AVN (avascular necrosis of bone) (HCC)    L hip  . Depression   . HIV (human immunodeficiency virus infection) ([place]) 08/2010   HLA (-), previous K103N  . Hyperlipidemia   . 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 Syphilis    treated    Surgeries: Procedure(s): LEFT TOTAL HIP ARTHROPLASTY ANTERIOR APPROACH on 06/23/2017   Consultants:   Discharged Condition: Improved  Hospital Course: Jason Townsend is an 57 y.o. male who was admitted 06/23/2017 for operative treatment ofUnilateral primary osteoarthritis, left hip. Patient has severe unremitting pain that affects sleep, daily activities, and work/hobbies. After pre-op clearance the patient was taken to the operating room on 06/23/2017 and underwent  Procedure(s): LEFT TOTAL HIP ARTHROPLASTY ANTERIOR APPROACH.    Patient was given perioperative antibiotics:  Anti-infectives (From admission, onward)   Start     Dose/Rate Route Frequency Ordered Stop   06/24/17 1000  abacavir-dolutegravir-lamiVUDine (TRIUMEQ) 600-50-300 MG per tablet 1 tablet     1 tablet Oral Daily 06/23/17 1432     06/23/17 1730  ceFAZolin (ANCEF) IVPB 1 g/50 mL premix     1 g 100 mL/hr over 30 Minutes Intravenous Every 6 hours 06/23/17 1432 06/23/17 2216   06/23/17 0945  ceFAZolin (ANCEF) IVPB 2g/100 mL premix     2 g 200 mL/hr over 30 Minutes Intravenous On call to O.R. 06/23/17 0941 06/23/17 1146       Patient was given sequential compression devices, early ambulation, and chemoprophylaxis to prevent DVT.  Patient benefited maximally from hospital  stay and there were no complications.    Recent vital signs:  Patient Vitals for the past 24 hrs:  BP Temp Temp src Pulse Resp SpO2 Height Weight  06/24/17 0942 130/86 98.7 F (37.1 C) Oral 76 14 99 % - -  06/24/17 0609 (!) 126/91 98.2 F (36.8 C) Oral 66 16 97 % - -  06/24/17 0303 123/76 97.9 F (36.6 C) Oral 78 16 97 % - -  06/23/17 2140 128/83 98.4 F (36.9 C) Oral 86 16 95 % - -  06/23/17 1829 121/80 98.4 F (36.9 C) Oral 79 - 96 % - -  06/23/17 1800 - - - - - - 6' 2"  (1.88 m) 205 lb (93 kg)  06/23/17 1545 133/82 97.6 F (36.4 C) Oral 69 16 98 % - -  06/23/17 1422 115/75 (!) 97.4 F (36.3 C) - 64 16 98 % - -  06/23/17 1400 108/67 - - (!) 59 15 100 % - -  06/23/17 1345 104/65 - - 64 14 99 % - -  06/23/17 1330 98/68 - - 67 17 98 % - -  06/23/17 1320 91/64 (!) 97.4 F (36.3 C) - 87 14 94 % - -     Recent laboratory studies:  Recent Labs    06/24/17 0519  WBC 10.8*  HGB 12.6*  HCT 37.4*  PLT 300  NA 137  K 4.8  CL 103  CO2 24  BUN 13  CREATININE 0.81  GLUCOSE 114*  CALCIUM 9.1     Discharge Medications:  Allergies as of 06/24/2017   No Known Allergies     Medication List    STOP taking these medications   diclofenac 75 MG EC tablet Commonly known as:  VOLTAREN   ibuprofen 200 MG tablet Commonly known as:  ADVIL,MOTRIN     TAKE these medications   acetaminophen 500 MG tablet Commonly known as:  TYLENOL Take 1,000 mg by mouth every 6 (six) hours as needed for mild pain.   aspirin 81 MG chewable tablet Chew 1 tablet (81 mg total) by mouth 2 (two) times daily.   atorvastatin 10 MG tablet Commonly known as:  LIPITOR Take 1 tablet (10 mg total) by mouth daily.   loratadine 10 MG tablet Commonly known as:  CLARITIN Take 10 mg by mouth daily.   methocarbamol 500 MG tablet Commonly known as:  ROBAXIN Take 1 tablet (500 mg total) by mouth every 6 (six) hours as needed for muscle spasms.   oxyCODONE 5 MG immediate release tablet Commonly known as:   Oxy IR/ROXICODONE Take 1-2 tablets (5-10 mg total) by mouth every 4 (four) hours as needed for moderate pain (pain score 4-6).   THERA-M Tabs Take 1 tablet by mouth daily.   TRIUMEQ 846-65-993 MG tablet Generic drug:  abacavir-dolutegravir-lamiVUDine TK 1 T PO QD What changed:    how much to take  how to take this  when to take this  additional instructions            Durable Medical Equipment  (From admission, onward)        Start     Ordered   06/23/17 1433  DME 3 n 1  Once     06/23/17 1432   06/23/17 1433  DME Walker rolling  Once    Question:  Patient needs a walker to treat with the following condition  Answer:  Status post total replacement of left hip   06/23/17 1432      Diagnostic Studies: Dg Pelvis Portable  Result Date: 06/23/2017 CLINICAL DATA:  Total left hip replacement. EXAM: PORTABLE PELVIS 1-2 VIEWS COMPARISON:  No recent prior. FINDINGS: Total left hip replacement anatomic alignment.  Hardware intact. IMPRESSION: Total left hip replacement anatomic alignment. Electronically Signed   By: Marcello Moores  Register   On: 06/23/2017 13:50   Dg C-arm 1-60 Min-no Report  Result Date: 06/23/2017 Fluoroscopy was utilized by the requesting physician.  No radiographic interpretation.   Dg Hip Operative Unilat W Or W/o Pelvis Left  Result Date: 06/23/2017 CLINICAL DATA:  Status post left total hip replacement. EXAM: OPERATIVE left HIP (WITH PELVIS IF PERFORMED) 2 VIEWS TECHNIQUE: Fluoroscopic spot image(s) were submitted for interpretation post-operatively. Radiation exposure index: 3.83 mGy. COMPARISON:  None. FINDINGS: Two intraoperative fluoroscopic images of the left hip demonstrate the femoral and acetabular components to be well situated. No fracture or dislocation is noted. IMPRESSION: Status post left total hip arthroplasty. Electronically Signed   By: Marijo Conception, M.D.   On: 06/23/2017 12:57    Disposition: Discharge disposition: 01-Home or Self  Care       Discharge Instructions    Discharge patient   Complete by:  As directed    Discharge disposition:  01-Home or Self Care   Discharge patient date:  06/24/2017      Follow-up Information    Mcarthur Rossetti, MD Follow up in 2 week(s).   Specialty:  Orthopedic Surgery Contact information: [place] [address] [phone]  Signed: BRONISLAUS VERDELL 06/24/2017, 10:58 AM

## 2017-06-24 NOTE — Progress Notes (Signed)
Subjective: 1 Day Post-Op Procedure(s) (LRB): LEFT TOTAL HIP ARTHROPLASTY ANTERIOR APPROACH (Left) Patient reports pain as moderate.    Objective: Vital signs in last 24 hours: Temp:  [97.4 F (36.3 C)-98.7 F (37.1 C)] 98.7 F (37.1 C) (06/01 0942) Pulse Rate:  [59-87] 76 (06/01 0942) Resp:  [14-17] 14 (06/01 0942) BP: (91-133)/(64-91) 130/86 (06/01 0942) SpO2:  [94 %-100 %] 99 % (06/01 0942) Weight:  [205 lb (93 kg)] 205 lb (93 kg) (05/31 1800)  Intake/Output from previous day: 05/31 0701 - 06/01 0700 In: 3200.5 [I.V.:2884.5; IV Piggyback:316] Out: 2450 [Urine:2200; Blood:250] Intake/Output this shift: Total I/O In: 268.8 [P.O.:120; I.V.:148.8] Out: -   Recent Labs    06/24/17 0519  HGB 12.6*   Recent Labs    06/24/17 0519  WBC 10.8*  RBC 3.95*  HCT 37.4*  PLT 300   Recent Labs    06/24/17 0519  NA 137  K 4.8  CL 103  CO2 24  BUN 13  CREATININE 0.81  GLUCOSE 114*  CALCIUM 9.1   No results for input(s): LABPT, INR in the last 72 hours.  Sensation intact distally Intact pulses distally Dorsiflexion/Plantar flexion intact Incision: scant drainage  Assessment/Plan: 1 Day Post-Op Procedure(s) (LRB): LEFT TOTAL HIP ARTHROPLASTY ANTERIOR APPROACH (Left) Up with therapy Discharge home with home health this afternoon.    MOXON MESSLER 06/24/2017, 10:56 AM

## 2017-06-24 NOTE — Discharge Instructions (Signed)

## 2017-06-24 NOTE — Progress Notes (Signed)
Physical Therapy Treatment Patient Details Name: Jason Townsend MRN: [id number] DOB: 12-Nov-1960 Today's Date: 06/24/2017    History of Present Illness Pt s/p direct anterior L THR and with hx of HIV    PT Comments    Pt progressing well.  Spouse present and reviewed step up into house and car transfers.   Follow Up Recommendations  Follow surgeon's recommendation for DC plan and follow-up therapies     Equipment Recommendations  None recommended by PT    Recommendations for Other Services       Precautions / Restrictions Precautions Precautions: Fall Restrictions Weight Bearing Restrictions: No Other Position/Activity Restrictions: WBAT    Mobility  Bed Mobility Overal bed mobility: Modified Independent Bed Mobility: Supine to Sit;Sit to Supine     Supine to sit: Modified independent (Device/Increase time) Sit to supine: Min assist   General bed mobility comments: spouse assisting   Transfers Overall transfer level: Needs assistance Equipment used: Rolling walker (2 wheeled) Transfers: Sit to/from Stand Sit to Stand: Supervision         General transfer comment: VCs for safe hand placement and LE management  Ambulation/Gait Ambulation/Gait assistance: Min guard Ambulation Distance (Feet): 150 Feet Assistive device: Rolling walker (2 wheeled) Gait Pattern/deviations: Step-to pattern;Decreased step length - right;Decreased step length - left;Shuffle;Trunk flexed Gait velocity: decr   General Gait Details: min cues for posture, position from RW and sequence   Stairs Stairs: Yes Stairs assistance: Min assist Stair Management: No rails;Forwards;With walker;Step to pattern Number of Stairs: 1 General stair comments: cues for sequence and foot/RW placement   Wheelchair Mobility    Modified Rankin (Stroke Patients Only)       Balance Overall balance assessment: Needs assistance Sitting-balance support: Feet supported;No upper extremity  supported Sitting balance-Leahy Scale: Good     Standing balance support: No upper extremity supported;During functional activity Standing balance-Leahy Scale: Fair                              Cognition Arousal/Alertness: Awake/alert Behavior During Therapy: WFL for tasks assessed/performed Overall Cognitive Status: Within Functional Limits for tasks assessed                                        Exercises Total Joint Exercises Ankle Circles/Pumps: AROM;Both;15 reps;Supine Quad Sets: AROM;Both;10 reps;Supine Heel Slides: AAROM;Left;20 reps;Supine Hip ABduction/ADduction: AAROM;Left;15 reps;Supine    General Comments        Pertinent Vitals/Pain Pain Assessment: 0-10 Pain Score: 7  Pain Location: L hip Pain Descriptors / Indicators: Sore Pain Intervention(s): Limited activity within patient's tolerance;Monitored during session;Premedicated before session;Ice applied    Home Living                      Prior Function            PT Goals (current goals can now be found in the care plan section) Acute Rehab PT Goals Patient Stated Goal: Home soon PT Goal Formulation: With patient Time For Goal Achievement: 06/30/17 Potential to Achieve Goals: Good Progress towards PT goals: Progressing toward goals    Frequency    7X/week      PT Plan Current plan remains appropriate    Co-evaluation              AM-PAC PT "6 Clicks" Daily Activity  Outcome  Measure  Difficulty turning over in bed (including adjusting bedclothes, sheets and blankets)?: A Lot Difficulty moving from lying on back to sitting on the side of the bed? : A Lot Difficulty sitting down on and standing up from a chair with arms (e.g., wheelchair, bedside commode, etc,.)?: A Lot Help needed moving to and from a bed to chair (including a wheelchair)?: A Little Help needed walking in hospital room?: A Little Help needed climbing 3-5 steps with a railing? :  A Little 6 Click Score: 15    End of Session Equipment Utilized During Treatment: Gait belt Activity Tolerance: Patient tolerated treatment well Patient left: in chair;with call bell/phone within reach Nurse Communication: Mobility status PT Visit Diagnosis: Difficulty in walking, not elsewhere classified (R26.2)     Time: 3976-7341 PT Time Calculation (min) (ACUTE ONLY): 20 min  Charges:  $Gait Training: 8-22 mins $Therapeutic Exercise: 8-22 mins                    G Codes:       Pg [phone]    Mohamad Bruso 06/24/2017, 4:17 PM

## 2017-06-24 NOTE — Progress Notes (Signed)
Discharge instructions provided.  Pt verbalized understanding.  No questions/concerns at this time.  Pt discharging to home with all equipment and belongings.

## 2017-06-24 NOTE — Evaluation (Signed)
Occupational Therapy Evaluation and Discharge Patient Details Name: Jason Townsend MRN: [id number] DOB: 06/07/1960 Today's Date: 06/24/2017    History of Present Illness Pt s/p direct anterior L THR and with hx of HIV   Clinical Impression   This 57 yo male admitted and underwent above presents to acute OT with all education completed, we will D/C from acute OT.    Follow Up Recommendations  No OT follow up;Supervision - Intermittent    Equipment Recommendations  None recommended by OT       Precautions / Restrictions Precautions Precautions: Fall Restrictions Weight Bearing Restrictions: No Other Position/Activity Restrictions: WBAT      Mobility Bed Mobility Overal bed mobility: Modified Independent Bed Mobility: Supine to Sit     Supine to sit: Modified independent (Device/Increase time)     General bed mobility comments: used RLE to A LLE off of bed  Transfers Overall transfer level: Needs assistance Equipment used: Rolling walker (2 wheeled) Transfers: Sit to/from Stand Sit to Stand: Supervision         General transfer comment: VCs for safe hand placement    Balance Overall balance assessment: Needs assistance Sitting-balance support: Feet supported;No upper extremity supported Sitting balance-Leahy Scale: Good     Standing balance support: No upper extremity supported;During functional activity Standing balance-Leahy Scale: Fair                             ADL either performed or assessed with clinical judgement   ADL Overall ADL's : Needs assistance/impaired Eating/Feeding: Independent;Sitting   Grooming: Supervision/safety;Standing   Upper Body Bathing: Set up;Sitting   Lower Body Bathing: Minimal assistance Lower Body Bathing Details (indicate cue type and reason): S sit<>stand Upper Body Dressing : Set up;Sitting   Lower Body Dressing: Moderate assistance Lower Body Dressing Details (indicate cue type and reason):  S sit<>stand Toilet Transfer: Min guard;Ambulation;RW;BSC;Grab bars Toilet Transfer Details (indicate cue type and reason): over toilet Toileting- Clothing Manipulation and Hygiene: Sit to/from stand;Supervision/safety          Educated pt on sequence of dressing LB to make it easier.     Vision Patient Visual Report: No change from baseline              Pertinent Vitals/Pain Pain Assessment: Faces Faces Pain Scale: Hurts a little bit Pain Location: L hip Pain Descriptors / Indicators: Sore Pain Intervention(s): Limited activity within patient's tolerance;Monitored during session;Repositioned;Ice applied     Hand Dominance Right   Extremity/Trunk Assessment Upper Extremity Assessment Upper Extremity Assessment: Overall WFL for tasks assessed           Communication Communication Communication: No difficulties   Cognition Arousal/Alertness: Awake/alert Behavior During Therapy: WFL for tasks assessed/performed Overall Cognitive Status: Within Functional Limits for tasks assessed                                                Home Living Family/patient expects to be discharged to:: Private residence Living Arrangements: Spouse/significant other Available Help at Discharge: Family;Available 24 hours/day Type of Home: House Home Access: Stairs to enter [employer] of Steps: 2 Entrance Stairs-Rails: Right Home Layout: One level     Bathroom Shower/Tub: Walk-in shower;Door   Bathroom Toilet: Handicapped height Bathroom Accessibility: Yes   Home Equipment: Environmental consultant - 2 wheels;Cane - single  point;Bedside commode;Grab bars - tub/shower;Grab bars - toilet;Hand held shower head;Shower seat - built in          Prior Functioning/Environment Level of Independence: Independent                 OT Problem List: Decreased range of motion;Pain         OT Goals(Current goals can be found in the care plan section) Acute Rehab OT  Goals Patient Stated Goal: Home soon  OT Frequency:                AM-PAC PT "6 Clicks" Daily Activity     Outcome Measure Help from another person eating meals?: None Help from another person taking care of personal grooming?: A Little Help from another person toileting, which includes using toliet, bedpan, or urinal?: A Little Help from another person bathing (including washing, rinsing, drying)?: A Little Help from another person to put on and taking off regular upper body clothing?: A Little Help from another person to put on and taking off regular lower body clothing?: A Lot 6 Click Score: 18   End of Session Equipment Utilized During Treatment: Gait belt;Rolling walker  Activity Tolerance: Patient tolerated treatment well Patient left: in chair;with call bell/phone within reach  OT Visit Diagnosis: Other abnormalities of gait and mobility (R26.89);Pain Pain - Right/Left: Left Pain - part of body: Hip                Time: 1027-2536 OT Time Calculation (min): 25 min Charges:  OT General Charges $OT Visit: 1 Visit OT Evaluation $OT Eval Moderate Complexity: 1 Mod OT Treatments $Self Care/Home Management : 8-22 mins Golden Circle, OTR/L [phone] 06/24/2017

## 2017-06-29 ENCOUNTER — Telehealth (INDEPENDENT_AMBULATORY_CARE_PROVIDER_SITE_OTHER): Payer: Self-pay | Admitting: Orthopaedic Surgery

## 2017-06-29 ENCOUNTER — Encounter (INDEPENDENT_AMBULATORY_CARE_PROVIDER_SITE_OTHER): Payer: Self-pay | Admitting: Orthopaedic Surgery

## 2017-06-29 ENCOUNTER — Encounter: Payer: Self-pay | Admitting: Infectious Diseases

## 2017-06-29 NOTE — Telephone Encounter (Signed)
Jason Townsend is requesting home health physical therapy verbal orders  2 x for 1 week 3 x for 1 week  CB # [phone]

## 2017-06-29 NOTE — Telephone Encounter (Signed)
Verbal order left on VM  

## 2017-06-30 ENCOUNTER — Encounter: Payer: Self-pay | Admitting: Infectious Diseases

## 2017-06-30 NOTE — Telephone Encounter (Signed)
Great - thanks

## 2017-07-06 ENCOUNTER — Encounter (INDEPENDENT_AMBULATORY_CARE_PROVIDER_SITE_OTHER): Payer: Self-pay | Admitting: Physician Assistant

## 2017-07-06 ENCOUNTER — Ambulatory Visit (INDEPENDENT_AMBULATORY_CARE_PROVIDER_SITE_OTHER): Payer: 59 | Admitting: Physician Assistant

## 2017-07-06 DIAGNOSIS — Z96642 Presence of left artificial hip joint: Secondary | ICD-10-CM

## 2017-07-06 NOTE — Progress Notes (Signed)
HPI: Jason Townsend returns today 2 weeks status post left total hip arthroplasty.  He is overall doing well.  He is already returned to work half days.  He is not taking any pain medication.  He is ambulating with a cane.  Is taking Robaxin just at night.  He has not been on 81 mg twice daily.  No fevers chills shortness of breath chest pain.  Physical exam: General well-developed well-nourished male in no acute distress made affect appropriate.  Psych alert and oriented x3. Left hip surgical incisions healing well no signs of infection.  No dehiscence.  Left calf supple nontender.  Dorsiflexion plantarflexion ankle intact.  Impression: Status post left total hip arthroplasty 2 weeks  Plan: He will continue to work on strengthening.  He is able to shower and get the incision wet.  Steri-Strips were reapplied.  Scar tissue mobilization encouraged.  Follow-up with [place] in 1 month sooner if there is any questions or concerns.

## 2017-08-07 ENCOUNTER — Encounter (INDEPENDENT_AMBULATORY_CARE_PROVIDER_SITE_OTHER): Payer: Self-pay | Admitting: Physician Assistant

## 2017-08-07 ENCOUNTER — Ambulatory Visit (INDEPENDENT_AMBULATORY_CARE_PROVIDER_SITE_OTHER): Payer: 59 | Admitting: Physician Assistant

## 2017-08-07 DIAGNOSIS — Z96642 Presence of left artificial hip joint: Secondary | ICD-10-CM

## 2017-08-07 NOTE — Progress Notes (Cosign Needed)
HPI: Jason Townsend returns today 6 weeks status post left total hip arthroplasty.  He is overall doing well.  He states he does get tired but he is been doing a lot of work remodeling at home and moving.  He denies any dizziness chest pain shortness of breath.  He does have occasional pain medial side of the knee.  He has some stiffness whenever he first begins to ambulate.  Review of systems: Denies any chest pain shortness of breath fevers or chills.  Physical exam: Left hip good internal/external rotation without pain.  Calf supple nontender.  Ankle dorsiflex plantarflex left foot.  Ambulates without an assistive device.  Impression: 6 weeks status post left total hip arthroplasty  Plan: He will continue to work on range of motion strengthening left hip.  Advised him he may have some stiffness whenever he first gets up around the first 3 months and this should resolve.  He will follow-up with [place] at 1 year postop AP pelvis lateral view of the left hip at that time.  He can always follow-up sooner if there is any questions or concerns.  Questions were encouraged and answered today.

## 2017-09-27 ENCOUNTER — Ambulatory Visit (INDEPENDENT_AMBULATORY_CARE_PROVIDER_SITE_OTHER): Payer: 59 | Admitting: Infectious Diseases

## 2017-09-27 ENCOUNTER — Encounter: Payer: Self-pay | Admitting: Infectious Diseases

## 2017-09-27 VITALS — BP 128/85 | HR 66 | Temp 98.4°F | Wt 198.3 lb

## 2017-09-27 DIAGNOSIS — M8700 Idiopathic aseptic necrosis of unspecified bone: Secondary | ICD-10-CM

## 2017-09-27 DIAGNOSIS — Z23 Encounter for immunization: Secondary | ICD-10-CM | POA: Diagnosis not present

## 2017-09-27 DIAGNOSIS — Z720 Tobacco use: Secondary | ICD-10-CM | POA: Diagnosis not present

## 2017-09-27 DIAGNOSIS — B20 Human immunodeficiency virus [HIV] disease: Secondary | ICD-10-CM

## 2017-09-27 DIAGNOSIS — E785 Hyperlipidemia, unspecified: Secondary | ICD-10-CM

## 2017-09-27 MED ORDER — VARENICLINE TARTRATE 1 MG PO TABS: ORAL_TABLET | ORAL | 1 refills | Status: DC

## 2017-09-27 NOTE — Addendum Note (Signed)
Addended by: Remus Hagedorn C on: 09/27/2017 09:23 AM   Modules accepted: Orders

## 2017-09-27 NOTE — Progress Notes (Signed)
   Subjective:    Patient ID: Jason Townsend, male    DOB: Jun 12, 1960, 57 y.o.   MRN: [id number]  HPI 57 yo M with hx of HIV+ (dx 08-2010, currently on triumeq 2013/07/24)), initially on truvada/darunavir-norvir/intelence, HLA-, prev K103N), previously followed at [facility].  He also has a hx of depression and sees a mental health provider. Prev partner died from suicide Jul 25, 2015.  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 H is clean, does not drink anymore.  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 Marland Kitchen  He is to have L THR 06-23-17. He has been doing very well with this. Has had f/u and will see ortho again in 1 year.  Got married 03-2017, positive, in care here.   Moved to [place] this summer. Going to [place] this month.  Still smoking 1/2 to 1 ppd.   Has not had colon.   HIV 1 RNA Quant (Copies/mL)  Date Value  02/03/2017 37 (H)   CD4 T Cell Abs (/uL)  Date Value  02/03/2017 880    Review of Systems  Constitutional: Negative for appetite change and unexpected weight change.  Respiratory: Negative for cough and shortness of breath.   Gastrointestinal: Negative for constipation and diarrhea.  Genitourinary: Negative for difficulty urinating.  Please see HPI. All other systems reviewed and negative.     Objective:   Physical Exam  Constitutional: He is oriented to person, place, and time. He appears well-developed and well-nourished.  HENT:  Mouth/Throat: No oropharyngeal exudate.  Eyes: Pupils are equal, round, and reactive to light. EOM are normal.  Neck: Normal range of motion. Neck supple.  Cardiovascular: Normal rate, regular rhythm and normal heart sounds.  Pulmonary/Chest: Effort normal and breath sounds normal.  Abdominal: Soft. Bowel sounds are normal. He exhibits no distension. There is no tenderness.  Musculoskeletal: Normal range of motion. He exhibits no edema.  Lymphadenopathy:   He has no cervical adenopathy.  Neurological: He is alert and oriented to person, place, and time.  Psychiatric: He has a normal mood and affect.      Assessment & Plan:

## 2017-09-27 NOTE — Addendum Note (Signed)
Addended by: Lenore Cordia on: 09/27/2017 10:38 AM   Modules accepted: Orders

## 2017-09-27 NOTE — Assessment & Plan Note (Signed)
Will refer him for colon.  Has gotten PCV Will give him flu vax, tdap Check labs today.  rtc in 9 months.

## 2017-09-27 NOTE — Assessment & Plan Note (Signed)
He is doing well My appreciation to Dr Ninfa Linden.

## 2017-09-27 NOTE — Assessment & Plan Note (Signed)
He asks for chantx, will send rx.

## 2017-09-27 NOTE — Assessment & Plan Note (Signed)
Will check his lipids at lab draw.

## 2017-09-29 ENCOUNTER — Encounter: Payer: Self-pay | Admitting: Infectious Diseases

## 2017-10-02 ENCOUNTER — Other Ambulatory Visit: Payer: Self-pay | Admitting: *Deleted

## 2017-10-02 DIAGNOSIS — B20 Human immunodeficiency virus [HIV] disease: Secondary | ICD-10-CM

## 2017-10-02 MED ORDER — TRIUMEQ 600-50-300 MG PO TABS: 1.0000 | ORAL_TABLET | Freq: Every day | ORAL | 4 refills | Status: DC

## 2017-10-09 ENCOUNTER — Ambulatory Visit: Payer: 59 | Admitting: Infectious Diseases

## 2017-10-09 ENCOUNTER — Encounter: Payer: Self-pay | Admitting: Infectious Diseases

## 2017-10-18 ENCOUNTER — Encounter: Payer: Self-pay | Admitting: Gastroenterology

## 2017-10-24 ENCOUNTER — Telehealth: Payer: Self-pay | Admitting: Behavioral Health

## 2017-10-24 NOTE — Telephone Encounter (Signed)
Prior Authorization initiated for Chantix continuing month pak 1mg  tablet through Cover my meds.  Will get denial or approval within 24-72 hours.  CASE ID: [id number]. Pricilla Riffle RN

## 2017-10-27 ENCOUNTER — Other Ambulatory Visit: Payer: Self-pay | Admitting: Behavioral Health

## 2017-10-27 DIAGNOSIS — Z716 Tobacco abuse counseling: Secondary | ICD-10-CM

## 2017-10-27 MED ORDER — VARENICLINE TARTRATE 1 MG PO TABS: ORAL_TABLET | ORAL | 1 refills | Status: DC

## 2017-10-27 MED ORDER — VARENICLINE TARTRATE 1 MG PO TABS: 1.0000 mg | ORAL_TABLET | Freq: Two times a day (BID) | ORAL | 1 refills | Status: DC

## 2017-10-27 NOTE — Telephone Encounter (Signed)
Prior Authorization approved [id number] called [facility] to inform the PA was favorable.  Pharmacist verified understanding. Pricilla Riffle RN

## 2017-11-17 ENCOUNTER — Other Ambulatory Visit: Payer: Self-pay

## 2017-11-17 ENCOUNTER — Ambulatory Visit (AMBULATORY_SURGERY_CENTER): Payer: Self-pay | Admitting: *Deleted

## 2017-11-17 VITALS — Ht 74.0 in | Wt 202.0 lb

## 2017-11-17 DIAGNOSIS — Z1211 Encounter for screening for malignant neoplasm of colon: Secondary | ICD-10-CM

## 2017-11-17 MED ORDER — PEG 3350-KCL-NA BICARB-NACL 420 G PO SOLR: 4000.0000 mL | Freq: Once | ORAL | 0 refills | Status: AC

## 2017-11-17 NOTE — Progress Notes (Signed)
No egg or soy allergy known to patient  No issues with past sedation with any surgeries  or procedures, no intubation problems  No diet pills per patient No home 02 use per patient  No blood thinners per patient  Pt denies issues with constipation  No A fib or A flutter  EMMI video sent to pt's e mail  

## 2017-11-20 ENCOUNTER — Encounter: Payer: Self-pay | Admitting: Gastroenterology

## 2017-11-27 ENCOUNTER — Other Ambulatory Visit: Payer: Self-pay | Admitting: Infectious Diseases

## 2017-11-27 DIAGNOSIS — E782 Mixed hyperlipidemia: Secondary | ICD-10-CM

## 2017-12-01 ENCOUNTER — Encounter: Payer: Self-pay | Admitting: Gastroenterology

## 2017-12-01 ENCOUNTER — Ambulatory Visit (AMBULATORY_SURGERY_CENTER): Payer: 59 | Admitting: Gastroenterology

## 2017-12-01 VITALS — BP 122/83 | HR 71 | Temp 98.4°F | Resp 12 | Ht 74.0 in | Wt 202.0 lb

## 2017-12-01 DIAGNOSIS — Z1211 Encounter for screening for malignant neoplasm of colon: Secondary | ICD-10-CM

## 2017-12-01 DIAGNOSIS — D122 Benign neoplasm of ascending colon: Secondary | ICD-10-CM

## 2017-12-01 MED ORDER — SODIUM CHLORIDE 0.9 % IV SOLN: 500.0000 mL | Freq: Once | INTRAVENOUS | Status: DC

## 2017-12-01 NOTE — Progress Notes (Signed)
Report to PACU, RN, vss, BBS= Clear.  

## 2017-12-01 NOTE — Progress Notes (Signed)
Pt's states no medical or surgical changes since previsit or office visit. 

## 2017-12-01 NOTE — Progress Notes (Signed)
Called to room to assist during endoscopic procedure.  Patient ID and intended procedure confirmed with present staff. Received instructions for my participation in the procedure from the performing physician.  

## 2017-12-01 NOTE — Op Note (Signed)
[place] Patient Name: Jason Townsend Procedure Date: 12/01/2017 9:25 AM MRN: [id number] Endoscopist: Justice Britain , MD Age: 57 Referring MD:  Date of Birth: 1960-09-05 Gender: Male Account #: [id number] Procedure:                Colonoscopy Indications:              Screening for colorectal malignant neoplasm, This                            is the patient's first colonoscopy Medicines:                Monitored Anesthesia Care Procedure:                Pre-Anesthesia Assessment:                           - Prior to the procedure, a History and Physical                            was performed, and patient medications and                            allergies were reviewed. The patient's tolerance of                            previous anesthesia was also reviewed. The risks                            and benefits of the procedure and the sedation                            options and risks were discussed with the patient.                            All questions were answered, and informed consent                            was obtained. Prior Anticoagulants: The patient has                            taken no previous anticoagulant or antiplatelet                            agents. ASA Grade Assessment: II - A patient with                            mild systemic disease. After reviewing the risks                            and benefits, the patient was deemed in                            satisfactory condition to undergo the procedure.  After obtaining informed consent, the colonoscope                            was passed under direct vision. Throughout the                            procedure, the patient's blood pressure, pulse, and                            oxygen saturations were monitored continuously. The                            Colonoscope was introduced through the anus and                            advanced to the 5 cm  into the ileum. The                            colonoscopy was performed without difficulty. The                            patient tolerated the procedure. The quality of the                            bowel preparation was evaluated using the BBPS                            [facility] Bowel Preparation Scale) with scores of:                            Right Colon = 3, Transverse Colon = 3 and Left                            Colon = 3 (entire mucosa seen well with no residual                            staining, small fragments of stool or opaque                            liquid). The total BBPS score equals 9. Scope In: 9:45:36 AM Scope Out: 10:02:59 AM Scope Withdrawal Time: 0 hours 12 minutes 57 seconds  Total Procedure Duration: 0 hours 17 minutes 23 seconds  Findings:                 The digital rectal exam findings include                            non-thrombosed internal hemorrhoids. Pertinent                            negatives include no palpable rectal lesions.                           The terminal ileum  and ileocecal valve appeared                            normal.                           A 1 mm polyp was found in the ascending colon. The                            polyp was sessile. The polyp was removed with a                            cold biopsy forceps. Resection and retrieval were                            complete.                           Normal mucosa was found in the entire colon                            otherwise.                           Non-bleeding non-thrombosed internal hemorrhoids                            were found during retroflexion, during perianal                            exam and during digital exam. The hemorrhoids were                            Grade II (internal hemorrhoids that prolapse but                            reduce spontaneously). Complications:            No immediate complications. Estimated Blood Loss:     Estimated blood  loss was minimal. Impression:               - Non-thrombosed internal hemorrhoids found on                            digital rectal exam.                           - The examined portion of the ileum was normal.                           - One 1 mm polyp in the ascending colon, removed                            with a cold biopsy forceps. Resected and retrieved.                           -  Normal mucosa in the entire examined colon                            otherwise.                           - Non-bleeding non-thrombosed internal hemorrhoids. Recommendation:           - The patient will be observed post-procedure,                            until all discharge criteria are met.                           - Discharge patient to home.                           - Patient has a contact number available for                            emergencies. The signs and symptoms of potential                            delayed complications were discussed with the                            patient. Return to normal activities tomorrow.                            Written discharge instructions were provided to the                            patient.                           - High fiber diet.                           - Consider initiation of fiber supplementation once                            daily over the counter                            (fibercon/metamucil/citrucel/benefiber).                           - Continue present medications.                           - Await pathology results.                           - Repeat colonoscopy in 5-10 years for surveillance                            based on pathology results and findings of  adenomatous tissue.                           - The findings and recommendations were discussed                            with the patient.                           - The findings and recommendations were discussed                             with the patient's family. Justice Britain, MD 12/01/2017 10:07:33 AM

## 2017-12-01 NOTE — Patient Instructions (Signed)
Impression/Recommendations:  Polyp handout given to patient. Hemorrhoid handout given to patient. High fiber diet handout given to patient.  Consider initiation of fiber supplement once daily - over th counter (Fibercon/Metamucil/Citrucel/Benefiber)  Continue present medications.  Await pathology results.  Repeat colonoscopy in 5-10 years for surveillance.   YOU HAD AN ENDOSCOPIC PROCEDURE TODAY AT [facility]:   Refer to the procedure report that was given to you for any specific questions about what was found during the examination.  If the procedure report does not answer your questions, please call your gastroenterologist to clarify.  If you requested that your care partner not be given the details of your procedure findings, then the procedure report has been included in a sealed envelope for you to review at your convenience later.  YOU SHOULD EXPECT: Some feelings of bloating in the abdomen. Passage of more gas than usual.  Walking can help get rid of the air that was put into your GI tract during the procedure and reduce the bloating. If you had a lower endoscopy (such as a colonoscopy or flexible sigmoidoscopy) you may notice spotting of blood in your stool or on the toilet paper. If you underwent a bowel prep for your procedure, you may not have a normal bowel movement for a few days.  Please Note:  You might notice some irritation and congestion in your nose or some drainage.  This is from the oxygen used during your procedure.  There is no need for concern and it should clear up in a day or so.  SYMPTOMS TO REPORT IMMEDIATELY:   Following lower endoscopy (colonoscopy or flexible sigmoidoscopy):  Excessive amounts of blood in the stool  Significant tenderness or worsening of abdominal pains  Swelling of the abdomen that is new, acute  Fever of 100F or higher For urgent or emergent issues, a gastroenterologist can be reached at any hour by calling [phone]  [phone].   DIET:  We do recommend a small meal at first, but then you may proceed to your regular diet.  Drink plenty of fluids but you should avoid alcoholic beverages for 24 hours.  ACTIVITY:  You should plan to take it easy for the rest of today and you should NOT DRIVE or use heavy machinery until tomorrow (because of the sedation medicines used during the test).    FOLLOW UP: Our staff will call the number listed on your records the next business day following your procedure to check on you and address any questions or concerns that you may have regarding the information given to you following your procedure. If we do not reach you, we will leave a message.  However, if you are feeling well and you are not experiencing any problems, there is no need to return our call.  We will assume that you have returned to your regular daily activities without incident.  If any biopsies were taken you will be contacted by phone or by letter within the next 1-3 weeks.  Please call us at [phone] if you have not heard about the biopsies in 3 weeks.    SIGNATURES/CONFIDENTIALITY: You and/or your care partner have signed paperwork which will be entered into your electronic medical record.  These signatures attest to the fact that that the information above on your After Visit Summary has been reviewed and is understood.  Full responsibility of the confidentiality of this discharge information lies with you and/or your care-partner.

## 2017-12-04 ENCOUNTER — Telehealth: Payer: Self-pay

## 2017-12-04 NOTE — Telephone Encounter (Signed)
  Follow up Call-  Call back number 12/01/2017  Post procedure Call Back phone  # [phone]  Permission to leave phone message Yes     Patient questions:  Do you have a fever, pain , or abdominal swelling? No. Pain Score  0 *  Have you tolerated food without any problems? Yes.    Have you been able to return to your normal activities? Yes.    Do you have any questions about your discharge instructions: Diet   No. Medications  No. Follow up visit  No.  Do you have questions or concerns about your Care? No.  Actions: * If pain score is 4 or above: No action needed, pain <4.

## 2017-12-04 NOTE — Telephone Encounter (Signed)
Left message on answering machine. 

## 2017-12-08 ENCOUNTER — Encounter: Payer: Self-pay | Admitting: Gastroenterology

## 2018-01-22 ENCOUNTER — Other Ambulatory Visit: Payer: Self-pay | Admitting: Infectious Diseases

## 2018-01-22 DIAGNOSIS — B20 Human immunodeficiency virus [HIV] disease: Secondary | ICD-10-CM

## 2018-03-21 ENCOUNTER — Encounter (INDEPENDENT_AMBULATORY_CARE_PROVIDER_SITE_OTHER): Payer: Self-pay

## 2018-05-23 ENCOUNTER — Other Ambulatory Visit: Payer: Self-pay | Admitting: Infectious Diseases

## 2018-05-23 DIAGNOSIS — B20 Human immunodeficiency virus [HIV] disease: Secondary | ICD-10-CM

## 2018-06-03 ENCOUNTER — Other Ambulatory Visit: Payer: Self-pay | Admitting: Infectious Diseases

## 2018-06-03 DIAGNOSIS — Z716 Tobacco abuse counseling: Secondary | ICD-10-CM

## 2018-06-28 ENCOUNTER — Ambulatory Visit: Payer: 59 | Admitting: Infectious Diseases

## 2018-06-29 ENCOUNTER — Ambulatory Visit: Payer: 59 | Admitting: Infectious Diseases

## 2018-07-13 ENCOUNTER — Ambulatory Visit: Payer: 59 | Admitting: Infectious Diseases

## 2018-07-30 ENCOUNTER — Telehealth: Payer: Self-pay | Admitting: Infectious Diseases

## 2018-07-30 NOTE — Telephone Encounter (Signed)
COVID-19 Pre-Screening Questions:07/30/18 ° ° °Do you currently have a fever (>100 °F), chills or unexplained body aches? NO  ° °Are you currently experiencing new cough, shortness of breath, sore throat, runny nose?NO °•  °Have you recently travelled outside the state of [place] in the last 14 days? NO °•  °Have you been in contact with someone that is currently pending confirmation of Covid19 testing or has been confirmed to have the Covid19 virus?  NO ° °**If the patient answers NO to ALL questions -  advise the patient to please call the clinic before coming to the office should any symptoms develop.  ° ° °

## 2018-07-31 ENCOUNTER — Encounter: Payer: Self-pay | Admitting: Infectious Diseases

## 2018-07-31 ENCOUNTER — Other Ambulatory Visit: Payer: Self-pay

## 2018-07-31 ENCOUNTER — Ambulatory Visit: Payer: 59 | Admitting: Infectious Diseases

## 2018-07-31 DIAGNOSIS — M8700 Idiopathic aseptic necrosis of unspecified bone: Secondary | ICD-10-CM | POA: Diagnosis not present

## 2018-07-31 DIAGNOSIS — F32A Depression, unspecified: Secondary | ICD-10-CM

## 2018-07-31 DIAGNOSIS — F329 Major depressive disorder, single episode, unspecified: Secondary | ICD-10-CM | POA: Diagnosis not present

## 2018-07-31 DIAGNOSIS — Z720 Tobacco use: Secondary | ICD-10-CM | POA: Diagnosis not present

## 2018-07-31 DIAGNOSIS — B20 Human immunodeficiency virus [HIV] disease: Secondary | ICD-10-CM | POA: Diagnosis not present

## 2018-07-31 MED ORDER — TRIUMEQ 600-50-300 MG PO TABS: 1.0000 | ORAL_TABLET | Freq: Every day | ORAL | 3 refills | Status: DC

## 2018-07-31 NOTE — Progress Notes (Signed)
   Subjective:    Patient ID: Jason Townsend, male    DOB: 09/11/1960, 58 y.o.   MRN: [id number]  HPI 58 yo M with hx of HIV+ (dx 08-2010, currently on triumeq 07/24/2013)), initially on truvada/darunavir-norvir/intelence, HLA-, prev K103N), previously followed at [facility].  He also has a hx of depression, had prev mental health provider. Prev partner died from suicide 07/25/15. Does feel like he needs counselor at this point.  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 Has been doing well with this. Totally back to normal activities.  Got married 03-2017, positive, in care here.  Works at [employer], but is working at home now. 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   Still smoking 1/2 to 1 ppd.   HIV 1 RNA Quant (Copies/mL)  Date Value  02/03/2017 37 (H)   CD4 T Cell Abs (/uL)  Date Value  02/03/2017 880    Review of Systems  Constitutional: Positive for unexpected weight change. Negative for appetite change.  Respiratory: Negative for cough and shortness of breath.   Gastrointestinal: Negative for constipation and diarrhea.  Genitourinary: Negative for difficulty urinating.  Psychiatric/Behavioral: Negative for dysphoric mood and sleep disturbance.  had colon this year. No sick exposures.  Please see HPI. All other systems reviewed and negative.     Objective:   Physical Exam Constitutional:      Appearance: Normal appearance.  HENT:     Head: Normocephalic and atraumatic.     Mouth/Throat:     Mouth: Mucous membranes are moist.     Pharynx: No oropharyngeal exudate.  Eyes:     Extraocular Movements: Extraocular movements intact.     Pupils: Pupils are equal, round, and reactive to light.  Neck:     Musculoskeletal: Normal range of motion and neck supple.  Cardiovascular:     Rate and Rhythm: Normal rate and regular rhythm.  Pulmonary:     Effort:  Pulmonary effort is normal.     Breath sounds: Normal breath sounds.  Abdominal:     General: Abdomen is flat. Bowel sounds are normal. There is no distension.     Palpations: Abdomen is soft.     Tenderness: There is no abdominal tenderness.  Musculoskeletal:     Right lower leg: No edema.     Left lower leg: No edema.  Neurological:     Mental Status: He is alert.  Psychiatric:        Mood and Affect: Mood normal.        Behavior: Behavior normal.       Assessment & Plan:

## 2018-07-31 NOTE — Assessment & Plan Note (Signed)
Doing well Offered to see regina, he defers.

## 2018-07-31 NOTE — Assessment & Plan Note (Signed)
He is doing very well Partner + Will check labs at work.  vax are up to date.  triumeq refilled rtc in 9 months

## 2018-07-31 NOTE — Assessment & Plan Note (Signed)
He is doing well with his L hip THR.  Will continue to f/u yearly with ortho.

## 2018-07-31 NOTE — Assessment & Plan Note (Signed)
Encouraged to quit. 

## 2018-08-13 ENCOUNTER — Ambulatory Visit: Payer: Self-pay | Admitting: Physician Assistant

## 2018-08-13 ENCOUNTER — Encounter: Payer: Self-pay | Admitting: Infectious Diseases

## 2018-08-15 ENCOUNTER — Ambulatory Visit: Payer: 59 | Admitting: Physician Assistant

## 2018-08-22 ENCOUNTER — Ambulatory Visit (INDEPENDENT_AMBULATORY_CARE_PROVIDER_SITE_OTHER): Payer: 59 | Admitting: Physician Assistant

## 2018-08-22 ENCOUNTER — Encounter: Payer: Self-pay | Admitting: Physician Assistant

## 2018-08-22 ENCOUNTER — Other Ambulatory Visit: Payer: Self-pay

## 2018-08-22 ENCOUNTER — Ambulatory Visit: Payer: Self-pay

## 2018-08-22 DIAGNOSIS — Z96642 Presence of left artificial hip joint: Secondary | ICD-10-CM | POA: Diagnosis not present

## 2018-08-22 NOTE — Progress Notes (Signed)
Office Visit Note   Patient: Jason Townsend           Date of Birth: December 27, 1960           MRN: [id number] Visit Date: 08/22/2018              Requested by: Campbell Riches, MD [place],  [place] [address] PCP: Campbell Riches, MD   Assessment & Plan: Visit Diagnoses:  1. History of left hip replacement     Plan: He is activities as tolerated.  He will follow-up with [place] on as-needed basis.  Questions are encouraged and answered at length.  Follow-Up Instructions: No follow-ups on file.   Orders:  Orders Placed This Encounter  Procedures  . XR HIP UNILAT W OR W/O PELVIS 1V LEFT   No orders of the defined types were placed in this encounter.     Procedures: No procedures performed   Clinical Data: No additional findings.   Subjective: Chief Complaint  Patient presents with  . Left Hip - Follow-up    HPI [name] returns 1 year status post left total hip arthroplasty.  He is overall doing great.  He states he can not tell that he ever had the surgery.  States "best thing I ever did".  Only question is if he has any arthritic changes in the right hip.  He is having no pain in the right hip at this time.  Review of Systems Negative for fevers chills shortness breath chest pain  Objective: Vital Signs: There were no vitals taken for this visit.  Physical Exam Constitutional:      Appearance: He is normal weight. He is not ill-appearing.  Pulmonary:     Effort: Pulmonary effort is normal.  Neurological:     Mental Status: He is alert.     Ortho Exam Left hip excellent range of motion without pain.  Left calf supple nontender.  Ambulates without any assistive device and a nonantalgic gait. Specialty Comments:  No specialty comments available.  Imaging: Xr Hip Unilat W Or W/o Pelvis 1v Left  Result Date: 08/22/2018 AP pelvis lateral view of the left hip: No acute fractures.  Well-seated left total hip arthroplasty  components.  Both hips well located on the AP pelvis.  Right hip joint space well maintained.  Minimal periarticular spurring right hip.    PMFS History: Patient Active Problem List   Diagnosis Date Noted  . Tobacco use 09/27/2017  . Status post total replacement of left hip 06/23/2017  . Unilateral primary osteoarthritis, left hip 05/15/2017  . HIV disease ([place]) 02/22/2017  . Hyperlipidemia 02/22/2017  . AVN (avascular necrosis of bone) ([place]) 02/22/2017  . Syphilis 02/22/2017  . Depression 02/22/2017  . Hepatitis A immune 02/08/2017  . Hepatitis B immune 02/06/2017   Past Medical History:  Diagnosis Date  . Allergy   . Arthritis    oa left hip  . AVN (avascular necrosis of bone) ([facility])    L hip  . Depression   . HIV (human immunodeficiency virus infection) ([place]) 08/2010   HLA (-), previous K103N  . Hyperlipidemia   . 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 Syphilis    treated    Family History  Problem Relation Age of Onset  . Dementia Father   . Colon cancer Neg Hx   . Colon polyps Neg Hx   . Esophageal cancer Neg Hx   . Stomach  cancer Neg Hx   . Rectal cancer Neg Hx     Past Surgical History:  Procedure Laterality Date  . CATARACT EXTRACTION Left   . TOTAL HIP ARTHROPLASTY Left 06/23/2017   Procedure: LEFT TOTAL HIP ARTHROPLASTY ANTERIOR APPROACH;  Surgeon: Mcarthur Rossetti, MD;  Location: [facility]S;  Service: Orthopedics;  Laterality: Left;  . wisdom teeth extraction  1982  . YAG LASER APPLICATION     Social History   Occupational History  . Not on file  Tobacco Use  . Smoking status: Current Every Day Smoker    Packs/day: 1.00    Years: 20.00    Pack years: 20.00    Types: Cigarettes  . Smokeless tobacco: Never Used  . Tobacco comment: talking about it   Substance and Sexual Activity  . Alcohol use: No    Frequency: Never  . Drug use: No    Comment: methadone last use end of 2017  . Sexual activity: Yes    Partners: Male     Comment: declined condoms

## 2018-10-18 ENCOUNTER — Other Ambulatory Visit: Payer: Self-pay

## 2018-10-18 ENCOUNTER — Ambulatory Visit (INDEPENDENT_AMBULATORY_CARE_PROVIDER_SITE_OTHER): Payer: 59

## 2018-10-18 DIAGNOSIS — Z23 Encounter for immunization: Secondary | ICD-10-CM

## 2018-10-26 ENCOUNTER — Other Ambulatory Visit: Payer: Self-pay | Admitting: Infectious Diseases

## 2018-10-26 DIAGNOSIS — E782 Mixed hyperlipidemia: Secondary | ICD-10-CM

## 2019-05-07 ENCOUNTER — Other Ambulatory Visit: Payer: Self-pay

## 2019-05-07 ENCOUNTER — Encounter: Payer: Self-pay | Admitting: Infectious Diseases

## 2019-05-07 ENCOUNTER — Ambulatory Visit: Payer: 59 | Admitting: Infectious Diseases

## 2019-05-07 DIAGNOSIS — E785 Hyperlipidemia, unspecified: Secondary | ICD-10-CM

## 2019-05-07 DIAGNOSIS — B20 Human immunodeficiency virus [HIV] disease: Secondary | ICD-10-CM

## 2019-05-07 DIAGNOSIS — Z96642 Presence of left artificial hip joint: Secondary | ICD-10-CM | POA: Diagnosis not present

## 2019-05-07 NOTE — Assessment & Plan Note (Signed)
Has had first [place] Partner is doing well Working from home Will get labs at work Has had colon rtc in 1 year.

## 2019-05-07 NOTE — Assessment & Plan Note (Signed)
THR due to AVR He is doing well, dismissed by ortho.  Will continue to f/u prn.

## 2019-05-07 NOTE — Progress Notes (Signed)
   Subjective:    Patient ID: Jason Townsend, male    DOB: November 26, 1960, 59 y.o.   MRN: [id number]  HPI 59 yo M with hx of HIV+ (dx 08-2010), currently on triumeq (06-2013), initially on truvada/darunavir-norvir/intelence, HLA-, prev K103N), previously followed at [facility].  He also has a hx of depression,.  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  Got married 03-2017, positive, in care here. Has been feeling well.  No problems with ART.  Hip is doing well, (I wish I would have done it sooner). Has been dismissed by ortho.   Has labs done at work ([facility]). Pending.  Colon 2019.   HIV 1 RNA Quant (Copies/mL)  Date Value  02/03/2017 37 (H)   CD4 T Cell Abs (/uL)  Date Value  02/03/2017 880    Review of Systems  Constitutional: Negative for appetite change and unexpected weight change.  Gastrointestinal: Negative for constipation and diarrhea.  Genitourinary: Negative for difficulty urinating.  Psychiatric/Behavioral: Negative for sleep disturbance.  Please see HPI. All other systems reviewed and negative.      Objective:   Physical Exam Constitutional:      Appearance: Normal appearance.  HENT:     Mouth/Throat:     Mouth: Mucous membranes are moist.     Pharynx: No oropharyngeal exudate.  Eyes:     Extraocular Movements: Extraocular movements intact.     Pupils: Pupils are equal, round, and reactive to light.  Cardiovascular:     Rate and Rhythm: Normal rate and regular rhythm.  Pulmonary:     Effort: Pulmonary effort is normal.     Breath sounds: Normal breath sounds.  Abdominal:     General: Bowel sounds are normal. There is no distension.     Palpations: Abdomen is soft.     Tenderness: There is no abdominal tenderness.  Musculoskeletal:     Cervical back: Normal range of motion and neck supple.     Right lower leg: No edema.     Left lower leg: No edema.    Neurological:     General: No focal deficit present.     Mental Status: He is alert.  Psychiatric:        Mood and Affect: Mood normal.           Assessment & Plan:

## 2019-05-07 NOTE — Assessment & Plan Note (Signed)
Lab Results  Component Value Date   CHOL 394 (H) 02/03/2017   HDL 47 02/03/2017   LDLCALC  02/03/2017     Comment:     . LDL cholesterol not calculated. Triglyceride levels greater than 400 mg/dL invalidate calculated LDL results. . Reference range: <100 . Desirable range <100 mg/dL for primary prevention;   <70 mg/dL for patients with CHD or diabetic patients  with > or = 2 CHD risk factors. Marland Kitchen LDL-C is now calculated using the Martin-Hopkins  calculation, which is a validated novel method providing  better accuracy than the Friedewald equation in the  estimation of LDL-C.  Cresenciano Genre et al. Annamaria Helling. [id number]): 2061-2068  (http://education.[facility].com/faq/FAQ164)    TRIG 752 (H) 02/03/2017   CHOLHDL 8.4 (H) 02/03/2017    Remains on statin.  Will repeat his labs.

## 2019-07-13 IMAGING — RF DG HIP (WITH PELVIS) OPERATIVE*L*
1 series · 2 of 2 positions shown · non-contrast
Comparison: None.

CLINICAL DATA: Status post left total hip replacement.

EXAM:
OPERATIVE left HIP (WITH PELVIS IF PERFORMED) 2 VIEWS
TECHNIQUE: Fluoroscopic spot image(s) were submitted for interpretation
post-operatively.
Radiation exposure index: 3.83 mGy.

[Series 1: run · 2 of 2 slices shown]
[im 1/2]
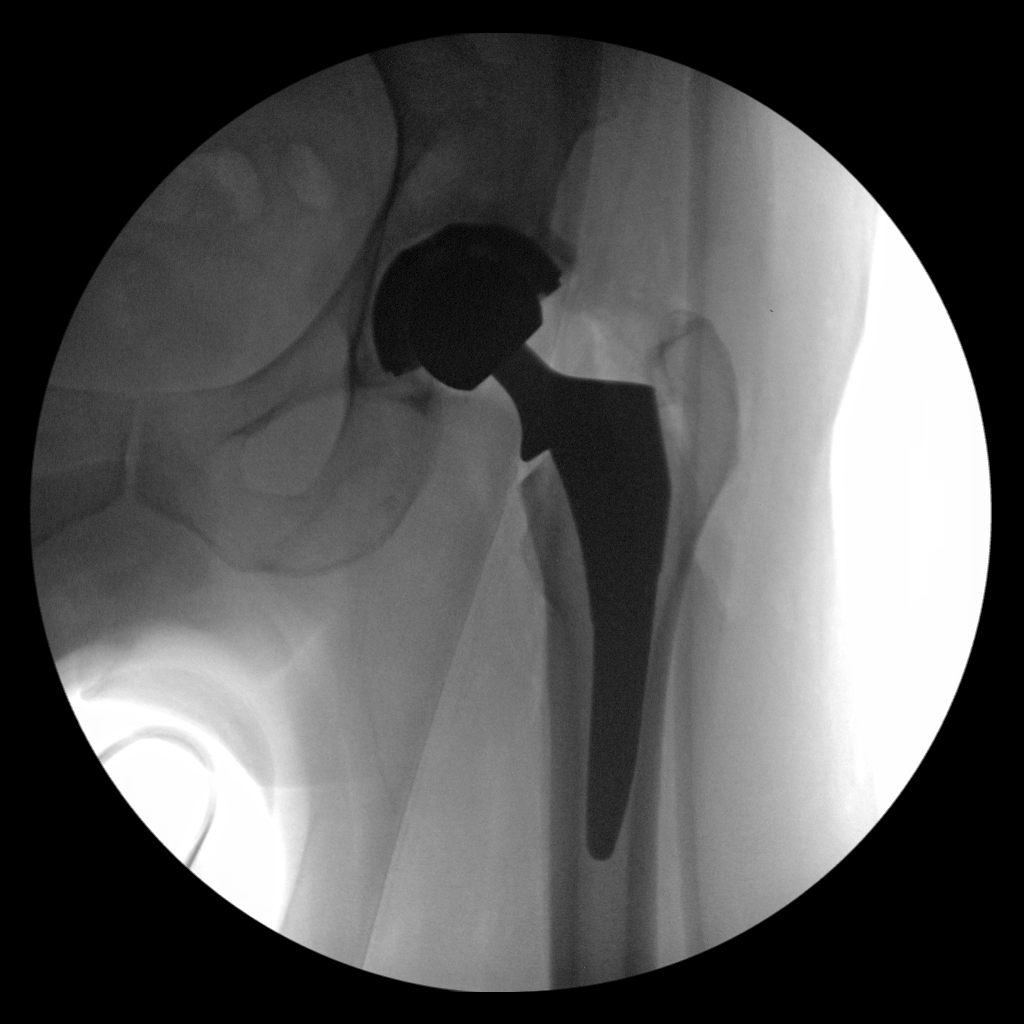
[im 2/2]
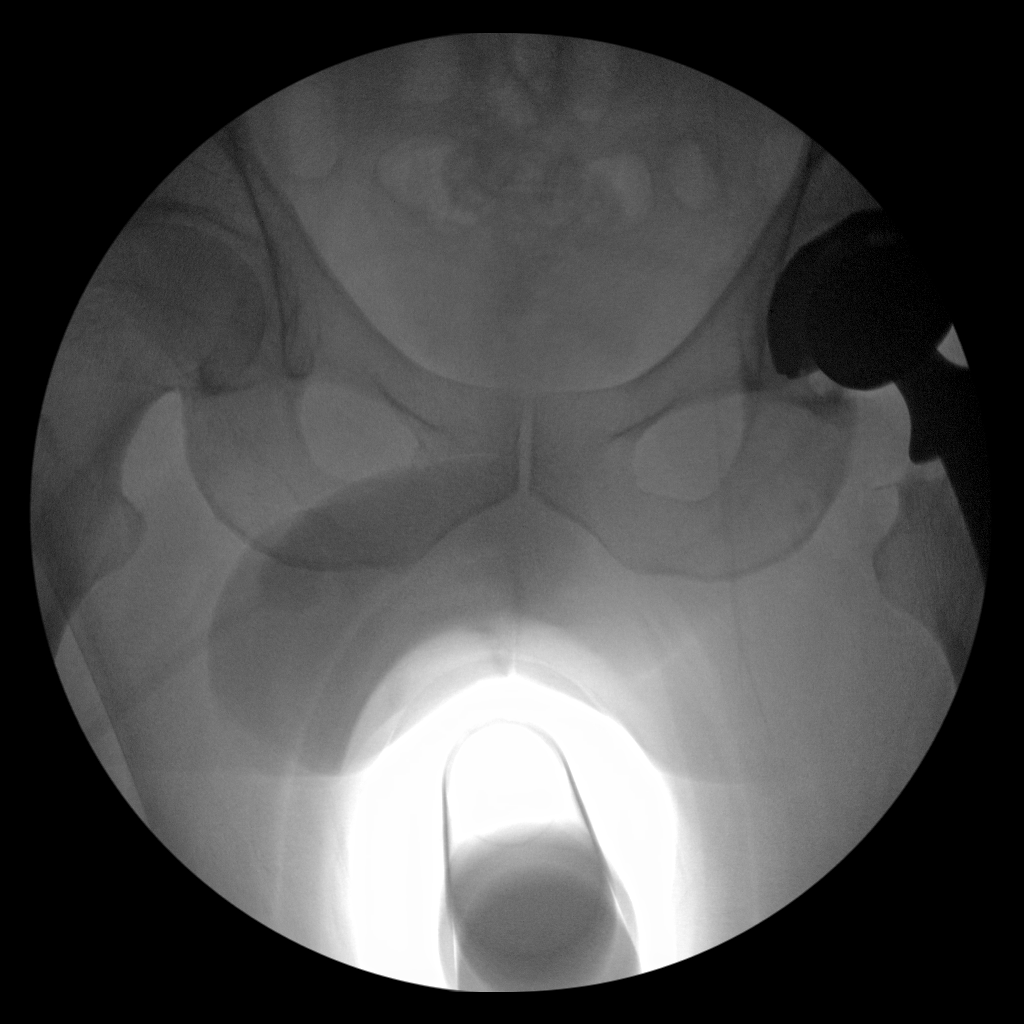

[2 of 2 positions shown; findings below may reference images not displayed]

FINDINGS: Two intraoperative fluoroscopic images of the left hip demonstrate
the femoral and acetabular components to be well situated. No
fracture or dislocation is noted.
IMPRESSION: Status post left total hip arthroplasty.

## 2019-09-19 ENCOUNTER — Other Ambulatory Visit: Payer: Self-pay | Admitting: Infectious Diseases

## 2019-09-19 DIAGNOSIS — E782 Mixed hyperlipidemia: Secondary | ICD-10-CM

## 2019-11-18 ENCOUNTER — Other Ambulatory Visit: Payer: Self-pay

## 2019-11-18 DIAGNOSIS — B20 Human immunodeficiency virus [HIV] disease: Secondary | ICD-10-CM

## 2019-11-18 MED ORDER — TRIUMEQ 600-50-300 MG PO TABS: 1.0000 | ORAL_TABLET | Freq: Every day | ORAL | 1 refills | Status: DC

## 2020-01-31 NOTE — Telephone Encounter (Signed)
Received forms for provider to review and sign. Will place original forms in providers box and copy's in accordion folder in triage.  [place]

## 2020-02-04 NOTE — Telephone Encounter (Signed)
Called pt to verify his info for the leave of absence form.  Will leave for nursing staff to forward.  Will have him seen by Marcie Bal

## 2020-02-07 NOTE — Telephone Encounter (Signed)
CMA faxed forms for [employer] on 1/13. Received confirmation that fax went through. Called patient to update status on forms. Understands that he will need to have counselor fill out second form regarding mental health.  Will mail copy of completed forms.  [place]

## 2020-02-11 ENCOUNTER — Telehealth: Payer: Self-pay | Admitting: *Deleted

## 2020-02-11 NOTE — Telephone Encounter (Signed)
Forms faxed back to [facility] with message: "the previous documentation received from your office was left partially blank and contained no recent office visit notes.  Please complete the following for the above named employee in it's entirety, ensuring to include any recent office visit notes with your response."  Please advise, as there are no recent office notes. Landis Gandy, RN

## 2020-02-12 NOTE — Telephone Encounter (Signed)
Send the 05-07-19 notes thanks

## 2020-02-13 ENCOUNTER — Other Ambulatory Visit: Payer: Self-pay

## 2020-02-13 ENCOUNTER — Ambulatory Visit: Payer: 59

## 2020-02-13 NOTE — Progress Notes (Unsigned)
Mental Health Therapist CCA Note   Name: Jamall Strohmeier  Total time: 60  Type of Service:COMPREHENSIVE CLINICAL ASSESSMENT  OBJECTIVE:  Mood: Euthymic and Affect: Appropriate Risk of harm to self or others: No plan to harm self or others   DIAGNOSIS: Major Depressive Disorder, Recurrent, Severe  Therapist met with client for the purpose of completing a comprehensive clinical assessment. The CCA is based on self-report from the individual, and will consider available information from family, acquaintances, and other professionals. Though efforts are made to encourage full and accurate disclosure by those involved, such disclosure is not assured.  If conflicting evidence becomes available, additional contact can be made with the individual and results/recommendations can be appropriately amended.  Client is a 60 year old, married, Caucasian, male, living in [place] with his husband of 3 years. Client reports that on most days he experiences depressed mood, irritability, decreased interest or pleasure in most activities, feelings of guilt, worthlessness, and hopelessness, difficulties with sleep and appetite, poor concentration, and indecisiveness with poor motivation.    Mental Status Exam: Client was alert, oriented x4, with no reported current SI, HI, or current symptoms of psychosis (risk low).  Affect was congruent. Speech was appropriate in tone and content. No observed memory impairments. Client engaged openly and appropriately with clinician.  BURNS DEPRESSION CHECKLIST: 37 (severe depression)  Hughes Better, LCSW

## 2020-02-14 NOTE — Telephone Encounter (Signed)
Printed the 05/07/19 notes to send, but [employer] sent a much longer, more detailed form to be completed as well. This is in your inbox. Landis Gandy, RN

## 2020-02-17 NOTE — Telephone Encounter (Signed)
Thank you [name]!

## 2020-02-18 ENCOUNTER — Ambulatory Visit: Payer: 59 | Admitting: Infectious Diseases

## 2020-02-18 ENCOUNTER — Other Ambulatory Visit: Payer: Self-pay

## 2020-02-18 ENCOUNTER — Encounter: Payer: Self-pay | Admitting: Infectious Diseases

## 2020-02-18 DIAGNOSIS — B20 Human immunodeficiency virus [HIV] disease: Secondary | ICD-10-CM

## 2020-02-18 DIAGNOSIS — E785 Hyperlipidemia, unspecified: Secondary | ICD-10-CM

## 2020-02-18 DIAGNOSIS — F32A Depression, unspecified: Secondary | ICD-10-CM

## 2020-02-18 MED ORDER — SERTRALINE HCL 25 MG PO TABS: 25.0000 mg | ORAL_TABLET | Freq: Every day | ORAL | 3 refills | Status: DC

## 2020-02-18 NOTE — Assessment & Plan Note (Signed)
Will recheck his lipids.  

## 2020-02-18 NOTE — Assessment & Plan Note (Signed)
He is doing well Will continue triumeq rec shingles vax if not done Other vax are up to date.  Will see him back in 6 weeks Labs at work.

## 2020-02-18 NOTE — Assessment & Plan Note (Addendum)
We talked at length about his current situation He will continue meeting with Marcie Bal Will start zoloft 25mg , rtc in 6 weeks.  Given homework to reflect on.  Check TSH and testosterone.  No SI family supportive

## 2020-02-18 NOTE — Progress Notes (Signed)
   Subjective:    Patient ID: Jason Townsend, male  DOB: 10-17-1960, 60 y.o.        MRN: [id number]   HPI 60yo M with hx of HIV+ (dx 08-2010), currently on triumeq (06-2013), initially on truvada/darunavir-norvir/intelence, HLA-, prev K103N), previously followed at [facility].  He also has a hx of depression,. 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 Got married 03-2017, positive, in care here. He is out of work for the last 3 weeks. Has been seeing janet. Wants to go back in middle of Feb. Still very tired, query if residual from depression.  Goes to bed at 9pm, sleeps til 7 or 8am. Doesn't feel rested when he awakens. Awakens at mid-sleep, has perseveration, struggles to get back to sleep.  "It feels weird to be 60 and be going through a mid-life crisis". Attributes to work.  Has prev been on anti-depressants. Wellbutrin. Zoloft. Knows it will take weeks to work.   HIV 1 RNA Quant (Copies/mL)  Date Value  02/03/2017 37 (H)   CD4 T Cell Abs (/uL)  Date Value  02/03/2017 880     Health Maintenance  Topic Date Due  . COVID-19 Vaccine (4 - Booster for Moderna series) 05/17/2020  . TETANUS/TDAP  09/28/2027  . COLONOSCOPY (Pts 45-79yrs Insurance coverage will need to be confirmed)  12/02/2027  . INFLUENZA VACCINE  Completed  . Hepatitis C Screening  Completed  . HIV Screening  Completed      Review of Systems  Constitutional: Negative for chills, fever and weight loss.  Respiratory: Negative for cough and shortness of breath.   Gastrointestinal: Negative for constipation and diarrhea.  Genitourinary: Negative for dysuria.  Psychiatric/Behavioral: Positive for depression. The patient has insomnia.     Please see HPI. All other systems reviewed and negative.     Objective:  Physical Exam Constitutional:      Appearance: He is normal weight.  HENT:     Mouth/Throat:     Mouth:  Mucous membranes are moist.     Pharynx: No oropharyngeal exudate.  Eyes:     Extraocular Movements: Extraocular movements intact.     Pupils: Pupils are equal, round, and reactive to light.  Cardiovascular:     Rate and Rhythm: Normal rate and regular rhythm.  Pulmonary:     Effort: Pulmonary effort is normal.  Abdominal:     General: Bowel sounds are normal. There is no distension.     Palpations: Abdomen is soft.     Tenderness: There is no abdominal tenderness.  Musculoskeletal:     Cervical back: Normal range of motion and neck supple.  Neurological:     General: No focal deficit present.     Mental Status: He is alert.  Psychiatric:        Attention and Perception: Attention normal.        Mood and Affect: Mood is depressed (mild).        Speech: Speech normal.        Thought Content: Thought content normal.        Cognition and Memory: Cognition normal.        Judgment: Judgment normal.            Assessment & Plan:

## 2020-02-19 ENCOUNTER — Telehealth: Payer: Self-pay | Admitting: *Deleted

## 2020-02-19 NOTE — Telephone Encounter (Signed)
Faxed form completed by Dr Johnnye Sima along with last two office notes as requested to [facility].  Copy for scanning into chart, additional copy in triage if there are any additional questions. Landis Gandy, RN

## 2020-02-20 ENCOUNTER — Other Ambulatory Visit: Payer: Self-pay

## 2020-02-20 ENCOUNTER — Ambulatory Visit: Payer: 59

## 2020-02-21 ENCOUNTER — Telehealth: Payer: Self-pay | Admitting: *Deleted

## 2020-02-21 DIAGNOSIS — R7989 Other specified abnormal findings of blood chemistry: Secondary | ICD-10-CM

## 2020-02-21 DIAGNOSIS — E291 Testicular hypofunction: Secondary | ICD-10-CM

## 2020-02-21 NOTE — Telephone Encounter (Signed)
Lab orders written for patient by Dr Johnnye Sima, patient notified. He would prefer to have the prescription mailed to his home address. RN confirmed. Landis Gandy, RN   Per Dr Johnnye Sima, "his testosterone was slightly low. He needs a repeat done between 8-10 am as well as LH and FSH. rx written, thanks"

## 2020-02-27 ENCOUNTER — Other Ambulatory Visit: Payer: Self-pay

## 2020-02-27 ENCOUNTER — Ambulatory Visit: Payer: 59

## 2020-03-03 ENCOUNTER — Encounter: Payer: Self-pay | Admitting: Infectious Diseases

## 2020-03-12 ENCOUNTER — Ambulatory Visit: Payer: 59

## 2020-03-12 ENCOUNTER — Other Ambulatory Visit: Payer: Self-pay

## 2020-03-19 ENCOUNTER — Other Ambulatory Visit: Payer: Self-pay

## 2020-03-19 ENCOUNTER — Ambulatory Visit: Payer: 59

## 2020-03-26 ENCOUNTER — Ambulatory Visit: Payer: 59

## 2020-03-26 ENCOUNTER — Ambulatory Visit (INDEPENDENT_AMBULATORY_CARE_PROVIDER_SITE_OTHER): Payer: 59 | Admitting: Infectious Diseases

## 2020-03-26 ENCOUNTER — Encounter: Payer: Self-pay | Admitting: Infectious Diseases

## 2020-03-26 ENCOUNTER — Other Ambulatory Visit: Payer: Self-pay

## 2020-03-26 VITALS — BP 148/88 | HR 68 | Temp 98.1°F | Ht 74.0 in | Wt 208.0 lb

## 2020-03-26 DIAGNOSIS — Z113 Encounter for screening for infections with a predominantly sexual mode of transmission: Secondary | ICD-10-CM

## 2020-03-26 DIAGNOSIS — B20 Human immunodeficiency virus [HIV] disease: Secondary | ICD-10-CM

## 2020-03-26 DIAGNOSIS — F32A Depression, unspecified: Secondary | ICD-10-CM

## 2020-03-26 DIAGNOSIS — Z79899 Other long term (current) drug therapy: Secondary | ICD-10-CM

## 2020-03-26 NOTE — Progress Notes (Signed)
   Subjective:    Patient ID: Jason Townsend, male  DOB: 03-10-1960, 60 y.o.        MRN: [id number]   HPI  60yo M with hx of HIV+ (dx 08-2010), currently on triumeq (06-2013), initially on truvada/darunavir-norvir/intelence, HLA-, prev K103N), previously followed at [facility].  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 Got married 03-2017, positive, in care here. He was out of work due to mental health. At f/u Jan 2022 he was started on zoloft.  He is in good spirits today, has gotten a puppy.  Back to work last week.  Feels like zoloft has helped. Doing less "mental gymnastics", feeling more "consistent".    HIV 1 RNA Quant (Copies/mL)  Date Value  02/03/2017 37 (H)   CD4 T Cell Abs (/uL)  Date Value  02/03/2017 880   Health Maintenance  Topic Date Due  . COVID-19 Vaccine (4 - Booster for Moderna series) 05/17/2020  . TETANUS/TDAP  09/28/2027  . COLONOSCOPY (Pts 45-56yr Insurance coverage will need to be confirmed)  12/02/2027  . INFLUENZA VACCINE  Completed  . Hepatitis C Screening  Completed  . HIV Screening  Completed  . HPV VACCINES  Aged Out      Review of Systems  Constitutional: Negative for malaise/fatigue.  Gastrointestinal: Negative for constipation and diarrhea.  Genitourinary: Negative for dysuria.  Psychiatric/Behavioral: Negative for depression. The patient does not have insomnia.     Please see HPI. All other systems reviewed and negative.     Objective:  Physical Exam Vitals reviewed.  Constitutional:      Appearance: Normal appearance.  HENT:     Mouth/Throat:     Mouth: Mucous membranes are moist.     Pharynx: No oropharyngeal exudate.  Eyes:     Extraocular Movements: Extraocular movements intact.     Pupils: Pupils are equal, round, and reactive to light.  Cardiovascular:     Pulses: Normal pulses.     Heart  sounds: Normal heart sounds.  Pulmonary:     Effort: Pulmonary effort is normal.     Breath sounds: Normal breath sounds.  Abdominal:     General: Bowel sounds are normal. There is no distension.     Palpations: Abdomen is soft.     Tenderness: There is no abdominal tenderness.  Musculoskeletal:     Cervical back: Normal range of motion.     Right lower leg: No edema.     Left lower leg: No edema.  Neurological:     General: No focal deficit present.     Mental Status: He is alert.  Psychiatric:        Mood and Affect: Mood normal.            Assessment & Plan:

## 2020-03-26 NOTE — Assessment & Plan Note (Addendum)
He is doing well Continues on his current rx rtc in 9 months with labs prior.  He asks about PCP, will go to [facility] here- [employer].

## 2020-03-26 NOTE — Assessment & Plan Note (Signed)
He is doing very well- mood is much better. Excited about work prospects.  Will continue on zoloft til his f/u in 9 months Appreciate f/u with janet prn.

## 2020-03-27 ENCOUNTER — Ambulatory Visit: Payer: 59 | Admitting: Infectious Diseases

## 2020-03-31 ENCOUNTER — Other Ambulatory Visit: Payer: Self-pay | Admitting: Infectious Diseases

## 2020-03-31 DIAGNOSIS — F32A Depression, unspecified: Secondary | ICD-10-CM

## 2020-04-07 ENCOUNTER — Telehealth: Payer: Self-pay | Admitting: *Deleted

## 2020-04-07 NOTE — Telephone Encounter (Signed)
Received request from [employer] for leave update. Copy made for triage, original placed in Dr Hatcher's inbox. Landis Gandy, RN

## 2020-04-16 ENCOUNTER — Other Ambulatory Visit: Payer: Self-pay

## 2020-04-16 ENCOUNTER — Ambulatory Visit: Payer: 59

## 2020-04-16 NOTE — Telephone Encounter (Signed)
Form completed by Dr Johnnye Sima. RN faxed on 04/15/20, placed at front for scanning. Landis Gandy, RN

## 2020-04-23 ENCOUNTER — Ambulatory Visit: Payer: 59

## 2020-04-24 ENCOUNTER — Other Ambulatory Visit: Payer: Self-pay | Admitting: Physician Assistant

## 2020-04-24 DIAGNOSIS — F17200 Nicotine dependence, unspecified, uncomplicated: Secondary | ICD-10-CM

## 2020-05-09 ENCOUNTER — Ambulatory Visit: Payer: 59

## 2020-05-15 ENCOUNTER — Other Ambulatory Visit: Payer: Self-pay

## 2020-05-15 ENCOUNTER — Ambulatory Visit
Admission: RE | Admit: 2020-05-15 | Discharge: 2020-05-15 | Disposition: A | Discharge status: Home / Self Care | Payer: 59 | Source: Ambulatory Visit | Attending: Physician Assistant | Admitting: Physician Assistant

## 2020-05-15 DIAGNOSIS — F17200 Nicotine dependence, unspecified, uncomplicated: Secondary | ICD-10-CM

## 2020-07-13 DIAGNOSIS — U071 COVID-19: Secondary | ICD-10-CM

## 2020-07-14 MED ORDER — NIRMATRELVIR/RITONAVIR (PAXLOVID)TABLET: 3.0000 | ORAL_TABLET | Freq: Two times a day (BID) | ORAL | 0 refills | Status: DC

## 2020-07-15 MED ORDER — NIRMATRELVIR/RITONAVIR (PAXLOVID)TABLET: 3.0000 | ORAL_TABLET | Freq: Two times a day (BID) | ORAL | 0 refills | Status: AC

## 2020-07-15 MED ORDER — NIRMATRELVIR/RITONAVIR (PAXLOVID)TABLET: 3.0000 | ORAL_TABLET | Freq: Two times a day (BID) | ORAL | 0 refills | Status: DC

## 2020-07-15 NOTE — Addendum Note (Signed)
Addended by: Lucie Leather D on: 07/15/2020 10:00 AM   Modules accepted: Orders

## 2020-07-15 NOTE — Addendum Note (Signed)
Addended byPrudencio Pair T on: 07/15/2020 10:09 AM   Modules accepted: Orders

## 2020-08-09 ENCOUNTER — Other Ambulatory Visit: Payer: Self-pay | Admitting: Infectious Diseases

## 2020-08-09 DIAGNOSIS — F32A Depression, unspecified: Secondary | ICD-10-CM

## 2020-08-10 ENCOUNTER — Other Ambulatory Visit: Payer: Self-pay | Admitting: Infectious Diseases

## 2020-08-10 DIAGNOSIS — B20 Human immunodeficiency virus [HIV] disease: Secondary | ICD-10-CM

## 2020-08-27 ENCOUNTER — Other Ambulatory Visit: Payer: Self-pay | Admitting: Infectious Diseases

## 2020-08-27 DIAGNOSIS — E782 Mixed hyperlipidemia: Secondary | ICD-10-CM

## 2020-10-12 ENCOUNTER — Other Ambulatory Visit: Payer: Self-pay | Admitting: Infectious Diseases

## 2020-10-12 DIAGNOSIS — F32A Depression, unspecified: Secondary | ICD-10-CM

## 2020-11-25 ENCOUNTER — Other Ambulatory Visit: Payer: Self-pay | Admitting: Infectious Diseases

## 2020-11-25 DIAGNOSIS — F32A Depression, unspecified: Secondary | ICD-10-CM

## 2020-11-26 NOTE — Telephone Encounter (Signed)
PENDING 11/4 APPT

## 2020-11-27 ENCOUNTER — Other Ambulatory Visit: Payer: Self-pay

## 2020-11-27 ENCOUNTER — Encounter: Payer: Self-pay | Admitting: Infectious Diseases

## 2020-11-27 ENCOUNTER — Ambulatory Visit (INDEPENDENT_AMBULATORY_CARE_PROVIDER_SITE_OTHER): Payer: 59 | Admitting: Infectious Diseases

## 2020-11-27 VITALS — BP 138/92 | HR 71 | Temp 97.8°F | Wt 210.0 lb

## 2020-11-27 DIAGNOSIS — Z113 Encounter for screening for infections with a predominantly sexual mode of transmission: Secondary | ICD-10-CM | POA: Diagnosis not present

## 2020-11-27 DIAGNOSIS — R6882 Decreased libido: Secondary | ICD-10-CM | POA: Insufficient documentation

## 2020-11-27 DIAGNOSIS — B20 Human immunodeficiency virus [HIV] disease: Secondary | ICD-10-CM

## 2020-11-27 DIAGNOSIS — Z96642 Presence of left artificial hip joint: Secondary | ICD-10-CM | POA: Diagnosis not present

## 2020-11-27 DIAGNOSIS — F32A Depression, unspecified: Secondary | ICD-10-CM | POA: Diagnosis not present

## 2020-11-27 DIAGNOSIS — Z79899 Other long term (current) drug therapy: Secondary | ICD-10-CM

## 2020-11-27 NOTE — Assessment & Plan Note (Signed)
This is much improved Will continue zoloft, refilled today.

## 2020-11-27 NOTE — Assessment & Plan Note (Signed)
We spoke about his decrease in libido.  I encouraged him to communicate about this with his spouse, and to set time aside for them.

## 2020-11-27 NOTE — Addendum Note (Signed)
Addended by: Caffie Pinto on: 11/27/2020 10:41 AM   Modules accepted: Orders

## 2020-11-27 NOTE — Progress Notes (Signed)
   Subjective:    Patient ID: Jason Townsend, male  DOB: September 14, 1960, 60 y.o.        MRN: [id number]   HPI 60 yo M with hx of HIV+ (dx 08-2010), currently on triumeq (06-2013), initially on truvada/darunavir-norvir/intelence, HLA-, prev K103N), previously followed at [facility].  He also has a hx of depression,.  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 He is clean, does not drink anymore.  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  They feel well, and would not notice except for scar.  Got married 03-2017, positive, in care here. Doing well.  Has new dog, border collie/poodle.   Has been feeling well, started a new job. Working remote.   Had shingles vax, completed series in August   HIV 1 RNA Quant (Copies/mL)  Date Value  02/03/2017 37 (H)   CD4 T Cell Abs (/uL)  Date Value  02/03/2017 880     Health Maintenance  Topic Date Due  . Zoster Vaccines- Shingrix (1 of 2) Never done  . Pneumococcal Vaccine 63-50 Years old (3 - PPSV23 if available, else PCV20) 05/27/2019  . COVID-19 Vaccine (4 - Booster for Moderna series) 01/12/2020  . TETANUS/TDAP  09/28/2027  . COLONOSCOPY (Pts 45-74yrs Insurance coverage will need to be confirmed)  12/02/2027  . INFLUENZA VACCINE  Completed  . Hepatitis C Screening  Completed  . HIV Screening  Completed  . HPV VACCINES  Aged Out      Review of Systems  Constitutional:  Negative for chills, fever and weight loss.  Respiratory:  Negative for cough and shortness of breath.   Gastrointestinal:  Negative for constipation and diarrhea.  Genitourinary:  Negative for dysuria.  Psychiatric/Behavioral:  Negative for depression.    Please see HPI. All other systems reviewed and negative.     Objective:  Physical Exam Vitals reviewed.  Constitutional:      Appearance: Normal appearance.  HENT:     Mouth/Throat:     Mouth: Mucous membranes are moist.     Pharynx: No oropharyngeal exudate.  Eyes:     Extraocular Movements:  Extraocular movements intact.     Pupils: Pupils are equal, round, and reactive to light.  Cardiovascular:     Rate and Rhythm: Normal rate and regular rhythm.  Pulmonary:     Effort: Pulmonary effort is normal.     Breath sounds: Normal breath sounds.  Abdominal:     General: Bowel sounds are normal. There is no distension.     Palpations: Abdomen is soft.     Tenderness: There is no abdominal tenderness.  Musculoskeletal:        General: Normal range of motion.     Cervical back: Normal range of motion and neck supple.     Right lower leg: No edema.     Left lower leg: No edema.  Neurological:     General: No focal deficit present.     Mental Status: He is alert.  Psychiatric:        Mood and Affect: Mood normal.          Assessment & Plan:

## 2020-11-27 NOTE — Assessment & Plan Note (Signed)
He is doing well Will check his labs today Will see him back in 9 months.  vax are updated.

## 2020-11-27 NOTE — Assessment & Plan Note (Signed)
Doing well Asx.

## 2020-12-01 LAB — T-HELPER CELLS (CD4) COUNT (NOT AT ARMC)
Absolute CD4: 1111 cells/uL (ref 490–1740)
CD4 T Helper %: 40 % (ref 30–61)
Total lymphocyte count: 2809 cells/uL (ref 850–3900)

## 2020-12-01 LAB — COMPREHENSIVE METABOLIC PANEL
AG Ratio: 1.6 (calc) (ref 1.0–2.5)
ALT: 24 U/L (ref 9–46)
AST: 16 U/L (ref 10–35)
Albumin: 4.6 g/dL (ref 3.6–5.1)
Alkaline phosphatase (APISO): 87 U/L (ref 35–144)
BUN: 13 mg/dL (ref 7–25)
CO2: 29 mmol/L (ref 20–32)
Calcium: 9.6 mg/dL (ref 8.6–10.3)
Chloride: 101 mmol/L (ref 98–110)
Creat: 1.07 mg/dL (ref 0.70–1.30)
Globulin: 2.8 g/dL (calc) (ref 1.9–3.7)
Glucose, Bld: 98 mg/dL (ref 65–99)
Potassium: 5 mmol/L (ref 3.5–5.3)
Sodium: 138 mmol/L (ref 135–146)
Total Bilirubin: 0.5 mg/dL (ref 0.2–1.2)
Total Protein: 7.4 g/dL (ref 6.1–8.1)

## 2020-12-01 LAB — HIV-1 RNA QUANT-NO REFLEX-BLD
HIV 1 RNA Quant: 251 Copies/mL — ABNORMAL HIGH
HIV-1 RNA Quant, Log: 2.4 Log cps/mL — ABNORMAL HIGH

## 2020-12-01 LAB — RPR: RPR Ser Ql: REACTIVE — AB

## 2020-12-01 LAB — CBC
HCT: 47.6 % (ref 38.5–50.0)
Hemoglobin: 16.5 g/dL (ref 13.2–17.1)
MCH: 31.4 pg (ref 27.0–33.0)
MCHC: 34.7 g/dL (ref 32.0–36.0)
MCV: 90.5 fL (ref 80.0–100.0)
MPV: 9.2 fL (ref 7.5–12.5)
Platelets: 306 10*3/uL (ref 140–400)
RBC: 5.26 10*6/uL (ref 4.20–5.80)
RDW: 13.7 % (ref 11.0–15.0)
WBC: 8 10*3/uL (ref 3.8–10.8)

## 2020-12-01 LAB — PSA: PSA: 1.41 ng/mL (ref <=4.00)

## 2020-12-01 LAB — RPR TITER: RPR Titer: 1:4 {titer} — ABNORMAL HIGH

## 2020-12-01 LAB — FLUORESCENT TREPONEMAL AB(FTA)-IGG-BLD: Fluorescent Treponemal ABS: REACTIVE — AB

## 2021-05-04 ENCOUNTER — Other Ambulatory Visit: Payer: Self-pay | Admitting: Physician Assistant

## 2021-05-04 DIAGNOSIS — F17200 Nicotine dependence, unspecified, uncomplicated: Secondary | ICD-10-CM

## 2021-05-07 ENCOUNTER — Other Ambulatory Visit: Payer: Self-pay | Admitting: Physician Assistant

## 2021-05-07 DIAGNOSIS — E782 Mixed hyperlipidemia: Secondary | ICD-10-CM

## 2021-05-07 DIAGNOSIS — F17200 Nicotine dependence, unspecified, uncomplicated: Secondary | ICD-10-CM

## 2021-05-14 ENCOUNTER — Ambulatory Visit
Admission: RE | Admit: 2021-05-14 | Discharge: 2021-05-14 | Disposition: A | Discharge status: Home / Self Care | Payer: 59 | Source: Ambulatory Visit | Attending: Physician Assistant | Admitting: Physician Assistant

## 2021-05-14 ENCOUNTER — Ambulatory Visit
Admission: RE | Admit: 2021-05-14 | Discharge: 2021-05-14 | Disposition: A | Discharge status: Home / Self Care | Payer: No Typology Code available for payment source | Source: Ambulatory Visit | Attending: Physician Assistant | Admitting: Physician Assistant

## 2021-05-14 DIAGNOSIS — E782 Mixed hyperlipidemia: Secondary | ICD-10-CM

## 2021-05-14 DIAGNOSIS — F17200 Nicotine dependence, unspecified, uncomplicated: Secondary | ICD-10-CM

## 2021-06-04 ENCOUNTER — Other Ambulatory Visit: Payer: 59

## 2021-07-05 ENCOUNTER — Encounter: Payer: Self-pay | Admitting: Infectious Diseases

## 2021-07-30 ENCOUNTER — Other Ambulatory Visit: Payer: Self-pay | Admitting: Infectious Diseases

## 2021-07-30 DIAGNOSIS — E782 Mixed hyperlipidemia: Secondary | ICD-10-CM

## 2021-08-27 ENCOUNTER — Ambulatory Visit: Payer: 59 | Admitting: Internal Medicine

## 2021-08-27 ENCOUNTER — Ambulatory Visit: Payer: 59 | Admitting: Infectious Diseases

## 2021-08-31 ENCOUNTER — Encounter: Payer: Self-pay | Admitting: Internal Medicine

## 2021-08-31 ENCOUNTER — Other Ambulatory Visit: Payer: Self-pay

## 2021-08-31 ENCOUNTER — Ambulatory Visit (INDEPENDENT_AMBULATORY_CARE_PROVIDER_SITE_OTHER): Payer: 59 | Admitting: Internal Medicine

## 2021-08-31 VITALS — BP 136/88 | HR 76 | Resp 16 | Ht 74.0 in | Wt 209.0 lb

## 2021-08-31 DIAGNOSIS — Z113 Encounter for screening for infections with a predominantly sexual mode of transmission: Secondary | ICD-10-CM | POA: Diagnosis not present

## 2021-08-31 DIAGNOSIS — Z720 Tobacco use: Secondary | ICD-10-CM

## 2021-08-31 DIAGNOSIS — A539 Syphilis, unspecified: Secondary | ICD-10-CM | POA: Diagnosis not present

## 2021-08-31 DIAGNOSIS — Z23 Encounter for immunization: Secondary | ICD-10-CM | POA: Diagnosis not present

## 2021-08-31 DIAGNOSIS — B20 Human immunodeficiency virus [HIV] disease: Secondary | ICD-10-CM

## 2021-08-31 DIAGNOSIS — Z7185 Encounter for immunization safety counseling: Secondary | ICD-10-CM | POA: Diagnosis not present

## 2021-08-31 NOTE — Assessment & Plan Note (Signed)
Screening offered today and patient declines.

## 2021-08-31 NOTE — Addendum Note (Signed)
Addended by: Theresia Majors A on: 08/31/2021 11:57 AM   Modules accepted: Orders

## 2021-08-31 NOTE — Assessment & Plan Note (Signed)
Patient here today for routine follow up and currently on Triumeq.  He has bee positive since 2012 and was last seen in November 2022 by Dr Johnnye Sima.  Has also previously been in care down in the [place] area.  His viral load was 251 copies last time he saw Dr Johnnye Sima.  Patient reports that he is adherent to Triumeq and reports no issues with tolerance or access to meds.  He does not need a refill today.  Will check labs and follow up in 6 months.

## 2021-08-31 NOTE — Assessment & Plan Note (Signed)
Has a history of treated neurosyphilis with RPR titer in November 2022 serofast at 1:4.  Will repeat today.

## 2021-08-31 NOTE — Assessment & Plan Note (Signed)
Reports his PCP recently started him on Bupropion to help with tobacco cessation.  He had follow up a couple weeks ago.  Reports he has not noticed much improvement yet but is hopeful it will kick in soon.

## 2021-08-31 NOTE — Assessment & Plan Note (Signed)
Recommended PCV 20 vaccination today and patient is agreeable.

## 2021-08-31 NOTE — Progress Notes (Signed)
[facility] for Infectious Disease   CHIEF COMPLAINT    HIV follow up.    SUBJECTIVE:    Jason Townsend is a 61 y.o. male with PMHx as below who presents to the clinic for HIV follow up.   Please see A&P for the details of today's visit and status of the patient's medical problems.   Patient's Medications  New Prescriptions   No medications on file  Previous Medications   ATORVASTATIN (LIPITOR) 10 MG TABLET    TAKE 1 TABLET BY MOUTH  DAILY   CHOLECALCIFEROL (VITAMIN D3) 125 MCG (5000 UT) CAPS       LORATADINE (CLARITIN) 10 MG TABLET       MULTIPLE VITAMINS-MINERALS (THERA-M) TABS    Take 1 tablet by mouth daily.    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   Syphilis    treated    Social History   Tobacco Use   Smoking status: Every Day    Packs/day: 0.50    Years: 20.00    Total pack years: 10.00    Types: Cigarettes   Smokeless tobacco: Never   Tobacco comments:    talking about it   Vaping Use   Vaping Use: Former  Substance Use Topics   Alcohol use: Yes    Comment: 1 bourbon a day   Drug use: No    Comment: methadone last use end of 2017    Family History  Problem Relation Age of Onset   Dementia Father    Colon cancer Neg Hx    Colon polyps Neg Hx    Esophageal cancer Neg Hx    Stomach cancer Neg Hx    Rectal cancer Neg Hx     No Known Allergies  Review of Systems  Constitutional: Negative.    Respiratory: Negative.    Cardiovascular: Negative.   Gastrointestinal: Negative.   Genitourinary: Negative.      OBJECTIVE:    Vitals:   08/31/21 1023  BP: 136/88  Pulse: 76  Resp: 16  SpO2: 96%  Weight: 209 lb (94.8 kg)  Height: 6' 2"  (1.88 m)     Body mass index is 26.83 kg/m.  Physical Exam Constitutional:      Appearance: Normal appearance.  HENT:     Head: Normocephalic and atraumatic.  Eyes:     Extraocular Movements: Extraocular movements intact.     Conjunctiva/sclera: Conjunctivae normal.  Pulmonary:     Effort: Pulmonary effort is normal. No respiratory distress.  Musculoskeletal:        General: Normal range of motion.     Cervical back: Normal range of motion and neck supple.  Skin:    General: Skin is warm and dry.  Neurological:  General: No focal deficit present.     Mental Status: He is alert and oriented to person, place, and time.     Labs and Microbiology:    Latest Ref Rng & Units 11/27/2020   10:32 AM 06/24/2017    5:19 AM 02/03/2017    9:30 AM  CMP  Glucose 65 - 99 mg/dL 98  114  136   BUN 7 - 25 mg/dL 13  13  14    Creatinine 0.70 - 1.30 mg/dL 1.07  0.81  0.98   Sodium 135 - 146 mmol/L 138  137  139   Potassium 3.5 - 5.3 mmol/L 5.0  4.8  4.7   Chloride 98 - 110 mmol/L 101  103  103   CO2 20 - 32 mmol/L 29  24  29    Calcium 8.6 - 10.3 mg/dL 9.6  9.1  9.8   Total Protein 6.1 - 8.1 g/dL 7.4   7.1   Total Bilirubin 0.2 - 1.2 mg/dL 0.5   0.4   AST 10 - 35 U/L 16   17   ALT 9 - 46 U/L 24   25       Latest Ref Rng & Units 11/27/2020   10:32 AM 06/24/2017    5:19 AM 06/13/2017    8:51 AM  CBC  WBC 3.8 - 10.8 Thousand/uL 8.0  10.8  7.9   Hemoglobin 13.2 - 17.1 g/dL 16.5  12.6  14.9   Hematocrit 38.5 - 50.0 % 47.6  37.4  43.1   Platelets 140 - 400 Thousand/uL 306  300  298      Lab Results  Component Value Date   HIV1RNAQUANT 251 (H) 11/27/2020   HIV1RNAQUANT 37 (H) 02/03/2017   CD4TABS 880 02/03/2017    RPR and STI: Lab  Results  Component Value Date   LABRPR REACTIVE (A) 11/27/2020   LABRPR REACTIVE (A) 02/03/2017   RPRTITER 1:4 (H) 11/27/2020   RPRTITER 1:8 (H) 02/03/2017    STI Results GC CT  02/03/2017 12:00 AM Negative  Negative     Hepatitis B: Lab Results  Component Value Date   HEPBSAB REACTIVE (A) 02/03/2017   HEPBSAG NON-REACTIVE 02/03/2017   HEPBCAB NON-REACTIVE 02/03/2017   Hepatitis C: Lab Results  Component Value Date   HEPCAB NON-REACTIVE 02/03/2017   Hepatitis A: Lab Results  Component Value Date   HAV REACTIVE (A) 02/03/2017   Lipids: Lab Results  Component Value Date   CHOL 394 (H) 02/03/2017   TRIG 752 (H) 02/03/2017   HDL 47 02/03/2017   CHOLHDL 8.4 (H) 02/03/2017   LDLCALC  02/03/2017     Comment:     . LDL cholesterol not calculated. Triglyceride levels greater than 400 mg/dL invalidate calculated LDL results. . Reference range: <100 . Desirable range <100 mg/dL for primary prevention;   <70 mg/dL for patients with CHD or diabetic patients  with > or = 2 CHD risk factors. Marland Kitchen LDL-C is now calculated using the Martin-Hopkins  calculation, which is a validated novel method providing  better accuracy than the Friedewald equation in the  estimation of LDL-C.  Cresenciano Genre et al. Annamaria Helling. 6967;893(81): 2061-2068  (http://education.[employer].com/faq/FAQ164)        ASSESSMENT & PLAN:    HIV disease ([place]) Patient here today for routine follow up and currently on Triumeq.  He has bee positive since 2012 and was last seen in November 2022 by Dr Johnnye Sima.  Has also previously been in care down in the [place] area.  His viral load was 251 copies last time he saw Dr Johnnye Sima.  Patient reports that he is adherent to Triumeq and reports no issues with tolerance or access to meds.  He does not need a refill today.  Will check labs and follow up in 6 months.   Syphilis Has a history of treated neurosyphilis with RPR titer in November 2022 serofast at 1:4.  Will  repeat today.   Vaccine counseling Recommended PCV 20 vaccination today and patient is agreeable.   Routine screening for STI (sexually transmitted infection) Screening offered today and patient declines.   Tobacco use Reports his PCP recently started him on Bupropion to help with tobacco cessation.  He had follow up a couple weeks ago.  Reports he has not noticed much improvement yet but is hopeful it will kick in soon.    Orders Placed This Encounter  Procedures   COMPLETE METABOLIC PANEL WITH GFR   CBC   RPR   T-helper cell (CD4)- ([facility] clinic only)   HIV-1 RNA quant-no reflex-bld        [facility] 08/31/2021, 10:43 AM

## 2021-09-01 LAB — T-HELPER CELL (CD4) - (RCID CLINIC ONLY)
CD4 % Helper T Cell: 37 % (ref 33–65)
CD4 T Cell Abs: 745 /uL (ref 400–1790)

## 2021-09-02 LAB — COMPLETE METABOLIC PANEL WITH GFR
AG Ratio: 1.7 (calc) (ref 1.0–2.5)
ALT: 31 U/L (ref 9–46)
AST: 19 U/L (ref 10–35)
Albumin: 4.6 g/dL (ref 3.6–5.1)
Alkaline phosphatase (APISO): 93 U/L (ref 35–144)
BUN: 15 mg/dL (ref 7–25)
CO2: 27 mmol/L (ref 20–32)
Calcium: 9.8 mg/dL (ref 8.6–10.3)
Chloride: 104 mmol/L (ref 98–110)
Creat: 0.85 mg/dL (ref 0.70–1.30)
Globulin: 2.7 g/dL (calc) (ref 1.9–3.7)
Glucose, Bld: 125 mg/dL — ABNORMAL HIGH (ref 65–99)
Potassium: 4.4 mmol/L (ref 3.5–5.3)
Sodium: 140 mmol/L (ref 135–146)
Total Bilirubin: 0.4 mg/dL (ref 0.2–1.2)
Total Protein: 7.3 g/dL (ref 6.1–8.1)
eGFR: 101 mL/min/{1.73_m2} (ref >=60)

## 2021-09-02 LAB — HIV-1 RNA QUANT-NO REFLEX-BLD
HIV 1 RNA Quant: 1440 Copies/mL — ABNORMAL HIGH
HIV-1 RNA Quant, Log: 3.16 Log cps/mL — ABNORMAL HIGH

## 2021-09-02 LAB — CBC
HCT: 45.6 % (ref 38.5–50.0)
Hemoglobin: 16 g/dL (ref 13.2–17.1)
MCH: 32.3 pg (ref 27.0–33.0)
MCHC: 35.1 g/dL (ref 32.0–36.0)
MCV: 91.9 fL (ref 80.0–100.0)
MPV: 9.1 fL (ref 7.5–12.5)
Platelets: 298 10*3/uL (ref 140–400)
RBC: 4.96 10*6/uL (ref 4.20–5.80)
RDW: 12.7 % (ref 11.0–15.0)
WBC: 8 10*3/uL (ref 3.8–10.8)

## 2021-09-02 LAB — RPR: RPR Ser Ql: REACTIVE — AB

## 2021-09-02 LAB — RPR TITER: RPR Titer: 1:4 {titer} — ABNORMAL HIGH

## 2021-09-02 LAB — FLUORESCENT TREPONEMAL AB(FTA)-IGG-BLD: Fluorescent Treponemal ABS: REACTIVE — AB

## 2021-09-06 ENCOUNTER — Encounter: Payer: Self-pay | Admitting: Internal Medicine

## 2021-09-06 DIAGNOSIS — B20 Human immunodeficiency virus [HIV] disease: Secondary | ICD-10-CM

## 2021-09-07 NOTE — Addendum Note (Signed)
Addended by: Mignon Pine on: 09/07/2021 08:15 AM   Modules accepted: Orders

## 2021-09-24 ENCOUNTER — Other Ambulatory Visit: Payer: 59

## 2021-09-24 ENCOUNTER — Other Ambulatory Visit: Payer: Self-pay

## 2021-09-24 DIAGNOSIS — B20 Human immunodeficiency virus [HIV] disease: Secondary | ICD-10-CM

## 2021-10-06 ENCOUNTER — Encounter: Payer: Self-pay | Admitting: Internal Medicine

## 2021-10-07 ENCOUNTER — Other Ambulatory Visit: Payer: Self-pay

## 2021-10-07 DIAGNOSIS — B20 Human immunodeficiency virus [HIV] disease: Secondary | ICD-10-CM

## 2021-10-07 MED ORDER — TRIUMEQ 600-50-300 MG PO TABS: 1.0000 | ORAL_TABLET | Freq: Every day | ORAL | 1 refills | Status: DC

## 2021-10-11 LAB — HIV-1 RNA QUANT-NO REFLEX-BLD
HIV 1 RNA Quant: <20 Copies/mL — ABNORMAL HIGH
HIV-1 RNA Quant, Log: <1.3 Log cps/mL — ABNORMAL HIGH

## 2021-10-11 LAB — HIV-1 GENOTYPING (RTI,PI,IN INHBTR)
Date Viral Load Collected: 9012023
HIV-1 Genotype: NOT DETECTED

## 2021-10-16 ENCOUNTER — Encounter: Payer: Self-pay | Admitting: Internal Medicine

## 2021-10-18 NOTE — Telephone Encounter (Signed)
Immunization records received via fax from [place]. Patient's Epic record updated with recent flu vaccination.  Binnie Kand, RN

## 2022-03-04 ENCOUNTER — Other Ambulatory Visit: Payer: Self-pay | Admitting: Internal Medicine

## 2022-03-04 DIAGNOSIS — B20 Human immunodeficiency virus [HIV] disease: Secondary | ICD-10-CM

## 2022-03-08 ENCOUNTER — Ambulatory Visit: Payer: 59 | Admitting: Internal Medicine

## 2022-03-08 ENCOUNTER — Encounter: Payer: 59 | Admitting: Infectious Diseases

## 2022-03-08 NOTE — Progress Notes (Deleted)
[facility]   CHIEF COMPLAINT    HIV follow up.    SUBJECTIVE:    Jason Townsend is a 62 y.o. male with PMHx as below who presents to the clinic for HIV follow up.   Please see A&P for the details of today's visit and status of the patient's medical problems.   Patient's Medications  New Prescriptions   No medications on file  Previous Medications   ATORVASTATIN (LIPITOR) 10 MG TABLET    TAKE 1 TABLET BY MOUTH  DAILY   CHOLECALCIFEROL (VITAMIN D3) 125 MCG (5000 UT) CAPS       LORATADINE (CLARITIN) 10 MG TABLET       MULTIPLE VITAMINS-MINERALS (THERA-M) TABS    Take 1 tablet by mouth daily.    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   Syphilis    treated    Social History   Tobacco Use   Smoking status: Every Day    Packs/day: 0.50    Years: 20.00    Total pack years: 10.00    Types: Cigarettes   Smokeless tobacco: Never   Tobacco comments:    talking about it   Vaping Use   Vaping Use: Former  Substance Use Topics   Alcohol use: Yes    Comment: 1 bourbon a day   Drug use: No    Comment: methadone last use end of 2017    Family History  Problem Relation Age of Onset   Dementia Father    Colon cancer Neg Hx    Colon polyps Neg Hx    Esophageal cancer Neg Hx    Stomach cancer Neg Hx    Rectal cancer Neg Hx     No Known Allergies  ROS   OBJECTIVE:    There were no vitals filed for this  visit.   There is no height or weight on file to calculate BMI.  Physical Exam  Labs and Microbiology:    Latest Ref Rng & Units 08/31/2021   10:51 AM 11/27/2020   10:32 AM 06/24/2017    5:19 AM  CMP  Glucose 65 - 99 mg/dL 125  98  114   BUN 7 - 25 mg/dL 15  13  13   $ Creatinine 0.70 - 1.30 mg/dL 0.85  1.07  0.81   Sodium 135 - 146 mmol/L 140  138  137   Potassium 3.5 - 5.3 mmol/L 4.4  5.0  4.8   Chloride 98 - 110 mmol/L 104  101  103   CO2 20 - 32 mmol/L 27  29  24   $ Calcium 8.6 - 10.3 mg/dL 9.8  9.6  9.1   Total Protein 6.1 - 8.1 g/dL 7.3  7.4  Total Bilirubin 0.2 - 1.2 mg/dL 0.4  0.5    AST 10 - 35 U/L 19  16    ALT 9 - 46 U/L 31  24        Latest Ref Rng & Units 08/31/2021   10:51 AM 11/27/2020   10:32 AM 06/24/2017    5:19 AM  CBC  WBC 3.8 - 10.8 Thousand/uL 8.0  8.0  10.8   Hemoglobin 13.2 - 17.1 g/dL 16.0  16.5  12.6   Hematocrit 38.5 - 50.0 % 45.6  47.6  37.4   Platelets 140 - 400 Thousand/uL 298  306  300      Lab Results  Component Value Date   HIV1RNAQUANT <20 (H) 09/24/2021   HIV1RNAQUANT 1,440 (H) 08/31/2021   HIV1RNAQUANT 251 (H) 11/27/2020   CD4TABS 745 08/31/2021   CD4TABS 880 02/03/2017    RPR and STI: Lab Results  Component Value Date   LABRPR REACTIVE (A) 08/31/2021   LABRPR REACTIVE (A) 11/27/2020   LABRPR REACTIVE (A) 02/03/2017   RPRTITER 1:4 (H) 08/31/2021   RPRTITER 1:4 (H) 11/27/2020   RPRTITER 1:8 (H) 02/03/2017    STI Results GC CT  02/03/2017 12:00 AM Negative  Negative     Hepatitis B: Lab Results  Component Value Date   HEPBSAB REACTIVE (A) 02/03/2017   HEPBSAG NON-REACTIVE 02/03/2017   HEPBCAB NON-REACTIVE 02/03/2017   Hepatitis C: Lab Results  Component Value Date   HEPCAB NON-REACTIVE 02/03/2017   Hepatitis A: Lab Results  Component Value Date   HAV REACTIVE (A) 02/03/2017   Lipids: Lab Results  Component Value Date   CHOL 394 (H) 02/03/2017   TRIG 752 (H) 02/03/2017   HDL 47 02/03/2017   CHOLHDL 8.4 (H)  02/03/2017   LDLCALC  02/03/2017     Comment:     . LDL cholesterol not calculated. Triglyceride levels greater than 400 mg/dL invalidate calculated LDL results. . Reference range: <100 . Desirable range <100 mg/dL for primary prevention;   <70 mg/dL for patients with CHD or diabetic patients  with > or = 2 CHD risk factors. Marland Kitchen LDL-C is now calculated using the Martin-Hopkins  calculation, which is a validated novel method providing  better accuracy than the Friedewald equation in the  estimation of LDL-C.  Cresenciano Genre et al. Annamaria Helling. WG:[id number]): 2061-2068  (http://education.[facility].com/faq/FAQ164)     Imaging: ***   ASSESSMENT & PLAN:    No problem-specific Assessment & Plan notes found for this encounter.   No orders of the defined types were placed in this encounter.      *** Vaccines Influenza: give every year COVID: recommend vaccination if not already done Prevnar 20: Give x 1 if no prior pneumonia vaccine.    - If only 1 dose of either PPSV 23 OR PCV 13 ----> give PCV 20 if > 1 year since last vaccine  - If received both PPSV 23 AND PCV 13 ----> give PCV 20 if > 5 years since last vaccine.  If < 5 years then wait to give PCV 20 Pneumovax-23: (if CD4 >200) give twice every 5 years apart before age 72, then once at age 25.  Give >8 weeks from [place] Prevnar-13: (preferably when CD4 >200) give once, give >1 year from last Pneumovax-23 Hepatitis A: give Havrix 2 dose series at 0 and 6-12 months if non-immune Hepatitis B: give Heplisav 2 dose series at 0 and 4 weeks if non-immune.  Repeat serology 2 months after vaccine and revaccinate if  needed MenACWY: 2 dose primary series 8 weeks apart, then 1 dose booster every 5 years HPV: Gardasil-9 at 0, 2, and 6 months for ages 9-26 should be vaccinated.  Ages 39-45 should be offered if appropriate Tdap: give every 10 years Shingles: give Shingrix 2 dose series at 0 and 2-6 months if >50 years on ART with CD4 cell  count >200 Varicella: primary vaccination may be considered in VZV seronegative persons aged >8 years (if CD4 >200)  MMR: vaccine should be given if born in 30 or after and do not have immunity (if CD4 >200)  Screening DEXA Scan: if age >56 Quantiferon: check at initiation of care Hepatitis C: check at initiation of care.  Screen annually if risk factors HLA B5701: check at initiation of care G6PD: check if starting therapy with oxidant drugs Lipids: check annually Urinalysis: check annually or every 6 months if on tenofovir Hgb A1c: check annually  ASCVD Risk Score Consider high-intensity statin therapy if 10-year ASCVD risk score >7.5% The ASCVD Risk score (Arnett DK, et al., 2019) failed to calculate for the following reasons:   Cannot find a previous HDL lab   Cannot find a previous total cholesterol lab   [facility] 03/08/2022, 10:25 AM  HIV: Patient presents today for routine HIV follow-up.  Currently he is on Triumeq.  He was last seen in August 2023 at which time he was not adherent with his daily medication.  His viral load at that time was noted to be 1440 copies.  He has since had better adherence repeat viral load testing on 09/24/2021 confirmed this with an undetectable viral load.  Will continue Triumeq and send refills today.  Check routine lab work and follow-up in 6 months.  Syphilis: History of treated neurosyphilis with RPR titer in August 2023 serofast at 1: 4 titer.  Will repeat today.  : ***

## 2022-05-06 ENCOUNTER — Other Ambulatory Visit: Payer: Self-pay | Admitting: Physician Assistant

## 2022-05-06 DIAGNOSIS — F17200 Nicotine dependence, unspecified, uncomplicated: Secondary | ICD-10-CM

## 2022-05-17 ENCOUNTER — Ambulatory Visit
Admission: RE | Admit: 2022-05-17 | Discharge: 2022-05-17 | Disposition: A | Discharge status: Home / Self Care | Payer: 59 | Source: Ambulatory Visit | Attending: Physician Assistant | Admitting: Physician Assistant

## 2022-05-17 DIAGNOSIS — F17200 Nicotine dependence, unspecified, uncomplicated: Secondary | ICD-10-CM

## 2022-05-18 ENCOUNTER — Other Ambulatory Visit: Payer: Self-pay

## 2022-05-18 ENCOUNTER — Other Ambulatory Visit (HOSPITAL_COMMUNITY)
Admission: RE | Admit: 2022-05-18 | Discharge: 2022-05-18 | Disposition: A | Discharge status: Home / Self Care | Payer: 59 | Source: Ambulatory Visit | Attending: Infectious Diseases | Admitting: Infectious Diseases

## 2022-05-18 ENCOUNTER — Encounter: Payer: Self-pay | Admitting: Infectious Diseases

## 2022-05-18 ENCOUNTER — Ambulatory Visit (INDEPENDENT_AMBULATORY_CARE_PROVIDER_SITE_OTHER): Payer: 59 | Admitting: Infectious Diseases

## 2022-05-18 VITALS — BP 139/77 | HR 84 | Temp 98.2°F | Ht 74.0 in | Wt 208.3 lb

## 2022-05-18 DIAGNOSIS — A539 Syphilis, unspecified: Secondary | ICD-10-CM

## 2022-05-18 DIAGNOSIS — Z113 Encounter for screening for infections with a predominantly sexual mode of transmission: Secondary | ICD-10-CM | POA: Insufficient documentation

## 2022-05-18 DIAGNOSIS — Z79899 Other long term (current) drug therapy: Secondary | ICD-10-CM

## 2022-05-18 DIAGNOSIS — Z720 Tobacco use: Secondary | ICD-10-CM

## 2022-05-18 DIAGNOSIS — F17210 Nicotine dependence, cigarettes, uncomplicated: Secondary | ICD-10-CM

## 2022-05-18 DIAGNOSIS — B20 Human immunodeficiency virus [HIV] disease: Secondary | ICD-10-CM | POA: Diagnosis not present

## 2022-05-18 NOTE — Assessment & Plan Note (Addendum)
He is doing well We talked about newer treatment options- he would not be a candidate for cabaneuva due to K103N in past.  Continue triumeq, discussed potential CV risk.  His vax are up to date He has had colon.  Rtc in 9 months.  Labs today.

## 2022-05-18 NOTE — Assessment & Plan Note (Signed)
Will recheck his RPR.  

## 2022-05-18 NOTE — Progress Notes (Signed)
   Subjective:    Patient ID: Myrna Blazer, male  DOB: September 22, 1960, 62 y.o.        MRN: [id number]   HPI 62 yo M with hx of HIV+ (dx 08-2010), currently on triumeq (06-2013), initially on truvada/darunavir-norvir/intelence, HLA-, prev K103N), previously followed at [facility].  He also has a hx of depression.  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  They feel well, and would not notice except for scar.  Got married 03-2017, positive, in care @ [facility].  Is turning 62 this year- does not like his job. Counting days til he can retire.  Going to be a "great-uncle" soon. Has been to [place], [place], [place] traveling.  Next year [place] and [place].   Had lung CA screening CT yesterday, results pending.  Still smokes 2/3 ppd. Has tried buproprion and did not like.  Wt is up, works from home, has a bourbon every night.   HIV 1 RNA Quant (Copies/mL)  Date Value  09/24/2021 <20 (H)  08/31/2021 1,440 (H)  11/27/2020 251 (H)   CD4 T Cell Abs (/uL)  Date Value  08/31/2021 745  02/03/2017 880     Health Maintenance  Topic Date Due   Zoster Vaccines- Shingrix (1 of 2) Never done   COVID-19 Vaccine (6 - 2023-24 season) 11/27/2021   INFLUENZA VACCINE  08/25/2022   DTaP/Tdap/Td (2 - Td or Tdap) 09/28/2027   COLONOSCOPY (Pts 45-53yrs Insurance coverage will need to be confirmed)  12/02/2027   Hepatitis C Screening  Completed   HIV Screening  Completed   HPV VACCINES  Aged Out      Review of Systems  Constitutional:  Negative for chills, fever and weight loss.  Respiratory:  Negative for shortness of breath.   Cardiovascular:  Negative for chest pain.  Gastrointestinal:  Negative for constipation and diarrhea.  Genitourinary:  Negative for dysuria.  Neurological:  Negative for headaches.  Psychiatric/Behavioral:  The patient is nervous/anxious. The patient does not have insomnia.     Please see HPI. All other systems reviewed and  negative.     Objective:  Physical Exam Vitals reviewed.  Constitutional:      Appearance: Normal appearance.  HENT:     Mouth/Throat:     Mouth: Mucous membranes are moist.     Pharynx: No oropharyngeal exudate.  Eyes:     Extraocular Movements: Extraocular movements intact.     Pupils: Pupils are equal, round, and reactive to light.  Cardiovascular:     Rate and Rhythm: Normal rate and regular rhythm.  Pulmonary:     Effort: Pulmonary effort is normal.     Breath sounds: Normal breath sounds.  Abdominal:     General: Bowel sounds are normal. There is no distension.     Palpations: Abdomen is soft.     Tenderness: There is no abdominal tenderness.  Musculoskeletal:        General: Normal range of motion.     Cervical back: Normal range of motion and neck supple.     Right lower leg: No edema.     Left lower leg: No edema.  Neurological:     General: No focal deficit present.     Mental Status: He is alert.            Assessment & Plan:

## 2022-05-18 NOTE — Assessment & Plan Note (Signed)
Encouraged to quit. 

## 2022-05-19 LAB — RPR, QUANT+TP ABS (REFLEX): Rapid Plasma Reagin, Quant: 1:8 {titer} — ABNORMAL HIGH

## 2022-05-19 LAB — COMPREHENSIVE METABOLIC PANEL

## 2022-05-19 LAB — T-HELPER CELLS (CD4) COUNT (NOT AT ARMC)
CD4 % Helper T Cell: 43 % (ref 33–65)
CD4 T Cell Abs: 1136 /uL (ref 400–1790)

## 2022-05-19 LAB — CBC
Hemoglobin: 16.4 g/dL (ref 13.0–17.7)
MCH: 31.7 pg (ref 26.6–33.0)
MCHC: 35 g/dL (ref 31.5–35.7)
Platelets: 314 10*3/uL (ref 150–450)
RDW: 13 % (ref 11.6–15.4)
WBC: 6.1 10*3/uL (ref 3.4–10.8)

## 2022-05-19 LAB — URINE CYTOLOGY ANCILLARY ONLY
Chlamydia: NEGATIVE
Comment: NEGATIVE
Comment: NORMAL
Neisseria Gonorrhea: NEGATIVE

## 2022-05-19 LAB — HIV-1 RNA QUANT-NO REFLEX-BLD: HIV-1 RNA Viral Load: 90 copies/mL

## 2022-05-19 LAB — LIPID PANEL

## 2022-05-20 ENCOUNTER — Encounter: Payer: Self-pay | Admitting: Infectious Diseases

## 2022-05-20 ENCOUNTER — Telehealth: Payer: Self-pay | Admitting: *Deleted

## 2022-05-20 LAB — LIPID PANEL
Chol/HDL Ratio: 3 ratio (ref 0.0–5.0)
Cholesterol, Total: 151 mg/dL (ref 100–199)
Triglycerides: 255 mg/dL — ABNORMAL HIGH (ref 0–149)
VLDL Cholesterol Cal: 40 mg/dL (ref 5–40)

## 2022-05-20 LAB — HIV-1 RNA QUANT-NO REFLEX-BLD: HIV-1 RNA Viral Load Log: 1.954 log10copy/mL

## 2022-05-20 LAB — CBC
Hematocrit: 46.8 % (ref 37.5–51.0)
MCV: 90 fL (ref 79–97)
RBC: 5.18 x10E6/uL (ref 4.14–5.80)

## 2022-05-20 LAB — COMPREHENSIVE METABOLIC PANEL
ALT: 34 IU/L (ref 0–44)
AST: 21 IU/L (ref 0–40)
Albumin: 4.6 g/dL (ref 3.8–4.9)
Alkaline Phosphatase: 108 IU/L (ref 44–121)
Calcium: 10.3 mg/dL — ABNORMAL HIGH (ref 8.7–10.2)
Chloride: 101 mmol/L (ref 96–106)
Creatinine, Ser: 1.08 mg/dL (ref 0.76–1.27)
Glucose: 90 mg/dL (ref 70–99)

## 2022-05-20 LAB — RPR: RPR Ser Ql: REACTIVE — AB

## 2022-05-20 LAB — RPR, QUANT+TP ABS (REFLEX): T Pallidum Abs: REACTIVE — AB

## 2022-05-20 NOTE — Telephone Encounter (Signed)
Received faxed lab results from [facility]:   RPR Reactive 1:8 Calcium 10.3 Triglycerides 255

## 2022-05-24 NOTE — Telephone Encounter (Signed)
Called pt to let him know that I love him as a patient.  "That's all I need" Thanks, have a great day!

## 2022-05-27 ENCOUNTER — Ambulatory Visit
Admission: EM | Admit: 2022-05-27 | Discharge: 2022-05-27 | Disposition: A | Discharge status: Home / Self Care | Payer: 59 | Attending: Nurse Practitioner | Admitting: Nurse Practitioner

## 2022-05-27 DIAGNOSIS — J208 Acute bronchitis due to other specified organisms: Secondary | ICD-10-CM

## 2022-05-27 DIAGNOSIS — B9689 Other specified bacterial agents as the cause of diseases classified elsewhere: Secondary | ICD-10-CM | POA: Diagnosis not present

## 2022-05-27 MED ORDER — AZITHROMYCIN 250 MG PO TABS
250.0000 mg | ORAL_TABLET | Freq: Every day | ORAL | 0 refills | Status: DC
Start: 2022-05-27 — End: 2022-07-13

## 2022-05-27 MED ORDER — PROMETHAZINE-DM 6.25-15 MG/5ML PO SYRP
5.0000 mL | ORAL_SOLUTION | Freq: Four times a day (QID) | ORAL | 0 refills | Status: DC | PRN
Start: 2022-05-27 — End: 2022-07-13

## 2022-05-27 NOTE — Discharge Instructions (Signed)
Zithromax as prescribed Promethazine DM as needed for cough.  Please note this medication can make you drowsy.  Do not drink alcohol or drive while on this medication Rest and fluids Follow-up with your PCP if your symptoms or not improving Please go to the ER for any worsening symptoms

## 2022-05-27 NOTE — ED Provider Notes (Signed)
[facility]    CSN: [id number] Arrival date & time: 05/27/22  1253      History   Chief Complaint Chief Complaint  Patient presents with   Cough    HPI Jason Townsend is a 62 y.o. male  presents for evaluation of URI symptoms for 10 days. Patient reports associated symptoms of cough, congestion. Denies N/V/D, fevers, ST,ear pain, body aches or SOB. Patient does not have a hx of asthma. He is an active smoker.  Husband has similar symptoms.  Pt does have a hx of HIV. Pt has taken cough medication  OTC for symptoms. Pt has no other concerns at this time.    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   Unilateral primary osteoarthritis, left hip 05/15/2017   HIV disease (HCC) 02/22/2017   Hyperlipidemia 02/22/2017   AVN (avascular necrosis of bone) (HCC) 02/22/2017   Syphilis 02/22/2017   Depression 02/22/2017   Hepatitis A immune 02/08/2017   Hepatitis B immune 02/06/2017    Past Surgical History:  Procedure Laterality Date   CATARACT EXTRACTION Left    TOTAL HIP ARTHROPLASTY Left 06/23/2017   Procedure: LEFT TOTAL HIP ARTHROPLASTY ANTERIOR APPROACH;  Surgeon: Kathryne Hitch, MD;  Location: [facility];  Service: Orthopedics;  Laterality: Left;   wisdom teeth extraction  1982   YAG LASER APPLICATION         Home Medications    Prior to Admission medications   Medication Sig Start  Date End Date Taking? Authorizing Provider  azithromycin (ZITHROMAX) 250 MG tablet Take 1 tablet (250 mg total) by mouth daily. Take first 2 tablets together, then 1 every day until finished. 05/27/22  Yes Radford Pax, NP  promethazine-dextromethorphan (PROMETHAZINE-DM) 6.25-15 MG/5ML syrup Take 5 mLs by mouth 4 (four) times daily as needed for cough. 05/27/22  Yes Radford Pax, NP  atorvastatin (LIPITOR) 10 MG tablet TAKE 1 TABLET BY MOUTH  DAILY 08/01/21   Vu, Trung T, MD  Cholecalciferol (VITAMIN D3) 125 MCG (5000 UT) CAPS  09/25/18   [provider]  loratadine (CLARITIN) 10 MG tablet  09/25/18   [provider]  Multiple Vitamins-Minerals (THERA-M) TABS Take 1 tablet by mouth daily.     [provider]  sertraline (ZOLOFT) 25 MG tablet TAKE 1 TABLET BY MOUTH  DAILY Patient not taking: Reported on 08/31/2021 11/27/20   Ginnie Smart, MD  TRIUMEQ 600-50-300 MG tablet TAKE 1 TABLET BY MOUTH DAILY 03/04/22   Kathlynn Grate, DO  Zinc 50 MG TABS  08/25/18   [provider]    Family History Family History  Problem Relation Age of Onset   Dementia Father    Colon cancer Neg Hx    Colon polyps Neg Hx    Esophageal cancer Neg Hx    Stomach  cancer Neg Hx    Rectal cancer Neg Hx     Social History Social History   Tobacco Use   Smoking status: Every Day    Packs/day: 0.50    Years: 20.00    Additional pack years: 0.00    Total pack years: 10.00    Types: Cigarettes   Smokeless tobacco: Never   Tobacco comments:    talking about it     Smoking 15 cigs per day  Vaping Use   Vaping Use: Former  Substance Use Topics   Alcohol use: Yes    Comment: 1 bourbon a day   Drug use: No    Comment: methadone last use end of 2017     Allergies   Patient has no known allergies.   Review of Systems Review of Systems  HENT:  Positive for congestion.   Respiratory:  Positive for cough.      Physical Exam Triage Vital Signs ED Triage Vitals  Enc  Vitals Group     BP 05/27/22 1303 (!) 157/95     Pulse Rate 05/27/22 1303 81     Resp 05/27/22 1303 18     Temp 05/27/22 1303 98 F (36.7 C)     Temp src --      SpO2 05/27/22 1303 95 %     Weight --      Height --      Head Circumference --      Peak Flow --      Pain Score 05/27/22 1312 0     Pain Loc --      Pain Edu? --      Excl. in GC? --    No data found.  Updated Vital Signs BP (!) 157/95   Pulse 81   Temp 98 F (36.7 C)   Resp 18   SpO2 95%   Visual Acuity Right Eye Distance:   Left Eye Distance:   Bilateral Distance:    Right Eye Near:   Left Eye Near:    Bilateral Near:     Physical Exam Vitals and nursing note reviewed.  Constitutional:      General: He is not in acute distress.    Appearance: Normal appearance. He is not ill-appearing or toxic-appearing.  HENT:     Head: Normocephalic and atraumatic.     Right Ear: Tympanic membrane and ear canal normal.     Left Ear: Tympanic membrane and ear canal normal.     Nose: Congestion present.     Mouth/Throat:     Mouth: Mucous membranes are moist.     Pharynx: No oropharyngeal exudate or posterior oropharyngeal erythema.  Eyes:     Pupils: Pupils are equal, round, and reactive to light.  Cardiovascular:     Rate and Rhythm: Normal rate and regular rhythm.     Heart sounds: Normal heart sounds.  Pulmonary:     Effort: Pulmonary effort is normal.     Breath sounds: Normal breath sounds.  Musculoskeletal:     Cervical back: Normal range of motion and neck supple.  Lymphadenopathy:     Cervical: No cervical adenopathy.  Skin:    General: Skin is warm and dry.  Neurological:     General: No focal deficit present.     Mental Status: He is alert and oriented to person, place, and time.  Psychiatric:        Mood and Affect: Mood normal.        Behavior:  Behavior normal.      UC Treatments / Results  Labs (all labs ordered are listed, but only abnormal results are displayed) Labs Reviewed -  No data to display  Comprehensive metabolic panel Order: [id number] Status: Final result     Visible to patient: Yes (seen)     Next appt: None     Dx: Human immunodeficiency virus (HIV) di...   0 Result Notes        Component Ref Range & Units 9 d ago 8 mo ago 1 yr ago 4 yr ago 5 yr ago  Glucose 70 - 99 mg/dL 90 409 High  R, CM 98 R, CM 114 High  R 136 High  R, CM  BUN 6 - 24 mg/dL 14 15 R 13 R 13 R 14 R  Creatinine, Ser 0.76 - 1.27 mg/dL 8.11 9.14 R 7.82 R 9.56 R 0.98 R, CM  eGFR >59 mL/min/1.73 79 101 R     BUN/Creatinine Ratio 9 - 20 13 SEE NOTE: R, CM NOT APPLICABLE R  NOT APPLICABLE R  Sodium 134 - 144 mmol/L 139 140 R 138 R 137 R 139 R  Potassium 3.5 - 5.2 mmol/L 4.8 4.4 R 5.0 R 4.8 R 4.7 R  Chloride 96 - 106 mmol/L 101 104 R 101 R 103 R 103 R  CO2 20 - 29 mmol/L 20 27 R 29 R 24 R 29 R  Calcium 8.7 - 10.2 mg/dL 21.3 High  9.8 R 9.6 R 9.1 R 9.8 R  Total Protein 6.0 - 8.5 g/dL 7.4 7.3 R 7.4 R  7.1 R  Albumin 3.8 - 4.9 g/dL 4.6      Globulin, Total 1.5 - 4.5 g/dL 2.8      Albumin/Globulin Ratio 1.2 - 2.2 1.6      Bilirubin Total 0.0 - 1.2 mg/dL 0.3 0.4 R 0.5 R  0.4 R  Alkaline Phosphatase 44 - 121 IU/L 108      AST 0 - 40 IU/L 21 19 R 16 R  17 R  ALT 0 - 44 IU/L 34 31 R 24 R  25 R  Resulting Agency [facility] [place] [facility]         Narrative      EKG   Radiology No results found.  Procedures Procedures (including critical care time)  Medications Ordered in UC Medications - No data to display  Initial Impression / Assessment and Plan / UC Course  I have reviewed the triage vital signs and the nursing notes.  Pertinent labs & imaging results that were available during my care of the patient were reviewed by me and considered in my medical decision making (see chart for details).    Reviewed most recent labs and progress notes Reviewed exam and symptoms with patient.  No red  flags Start Zithromax and Promethazine DM.  Side effect profile PCP follow-up if symptoms do not improve ER precautions reviewed and patient verbalized understanding Final Clinical Impressions(s) / UC Diagnoses   Final diagnoses:  Acute bacterial bronchitis     Discharge Instructions      Zithromax as prescribed Promethazine DM as needed for cough.  Please note this medication can make you drowsy.  Do not drink alcohol or drive while on this medication Rest and fluids Follow-up with your PCP if your symptoms or not improving Please go to the ER for any worsening symptoms   ED Prescriptions     Medication Sig Dispense  Auth. Provider   azithromycin (ZITHROMAX) 250 MG tablet Take 1 tablet (250 mg total) by mouth daily. Take first 2 tablets together, then 1 every day until finished. 6 tablet Radford Pax, NP   promethazine-dextromethorphan (PROMETHAZINE-DM) 6.25-15 MG/5ML syrup Take 5 mLs by mouth 4 (four) times daily as needed for cough. 118 mL Radford Pax, NP      PDMP not reviewed this encounter.   Radford Pax, NP 05/27/22 1335

## 2022-05-27 NOTE — ED Triage Notes (Signed)
Patient to Urgent Care with complaints of cough/ chest congestion that started over a week ago. Worsens in the evening. Denies any known fevers. Reports URI approx 2-3 weeks ago that developed into this cough. Husband sick with same symptoms.   Taking dayquil/ nyquil/ mucinex-DM/ cough suppressants. Reports feeling like he has some congestion but is unable to produce anything.

## 2022-06-30 ENCOUNTER — Other Ambulatory Visit: Payer: Self-pay | Admitting: Internal Medicine

## 2022-06-30 DIAGNOSIS — E782 Mixed hyperlipidemia: Secondary | ICD-10-CM

## 2022-07-12 ENCOUNTER — Observation Stay: Payer: 59 | Admitting: General Practice

## 2022-07-12 ENCOUNTER — Emergency Department: Payer: 59

## 2022-07-12 ENCOUNTER — Encounter: Payer: Self-pay | Admitting: Emergency Medicine

## 2022-07-12 ENCOUNTER — Ambulatory Visit
Admission: EM | Admit: 2022-07-12 | Discharge: 2022-07-12 | Disposition: A | Discharge status: Home / Self Care | Payer: 59

## 2022-07-12 ENCOUNTER — Observation Stay
Admission: EM | Admit: 2022-07-12 | Discharge: 2022-07-13 | Disposition: A | Discharge status: Home / Self Care | Payer: 59 | Attending: Surgery | Admitting: Surgery

## 2022-07-12 ENCOUNTER — Encounter
Admission: EM | Disposition: A | Discharge status: Home / Self Care | Payer: Self-pay | Source: Home / Self Care | Attending: Emergency Medicine

## 2022-07-12 ENCOUNTER — Encounter: Payer: Self-pay | Admitting: Intensive Care

## 2022-07-12 ENCOUNTER — Other Ambulatory Visit: Payer: Self-pay

## 2022-07-12 DIAGNOSIS — R1031 Right lower quadrant pain: Secondary | ICD-10-CM | POA: Diagnosis present

## 2022-07-12 DIAGNOSIS — R10813 Right lower quadrant abdominal tenderness: Secondary | ICD-10-CM | POA: Diagnosis not present

## 2022-07-12 DIAGNOSIS — Z96642 Presence of left artificial hip joint: Secondary | ICD-10-CM | POA: Insufficient documentation

## 2022-07-12 DIAGNOSIS — K3580 Unspecified acute appendicitis: Secondary | ICD-10-CM | POA: Diagnosis not present

## 2022-07-12 DIAGNOSIS — Z79899 Other long term (current) drug therapy: Secondary | ICD-10-CM | POA: Insufficient documentation

## 2022-07-12 DIAGNOSIS — B20 Human immunodeficiency virus [HIV] disease: Secondary | ICD-10-CM | POA: Insufficient documentation

## 2022-07-12 DIAGNOSIS — F17210 Nicotine dependence, cigarettes, uncomplicated: Secondary | ICD-10-CM | POA: Insufficient documentation

## 2022-07-12 DIAGNOSIS — Z21 Asymptomatic human immunodeficiency virus [HIV] infection status: Secondary | ICD-10-CM | POA: Insufficient documentation

## 2022-07-12 HISTORY — PX: XI ROBOTIC LAPAROSCOPIC ASSISTED APPENDECTOMY: SHX6877

## 2022-07-12 LAB — COMPREHENSIVE METABOLIC PANEL
ALT: 26 U/L (ref 0–44)
AST: 19 U/L (ref 15–41)
Albumin: 4.5 g/dL (ref 3.5–5.0)
Alkaline Phosphatase: 81 U/L (ref 38–126)
Anion gap: 14 (ref 5–15)
BUN: 17 mg/dL (ref 6–20)
CO2: 20 mmol/L — ABNORMAL LOW (ref 22–32)
Calcium: 9.6 mg/dL (ref 8.9–10.3)
Chloride: 104 mmol/L (ref 98–111)
Creatinine, Ser: 1.03 mg/dL (ref 0.61–1.24)
GFR, Estimated: >60 mL/min (ref >60)
Glucose, Bld: 161 mg/dL — ABNORMAL HIGH (ref 70–99)
Potassium: 4 mmol/L (ref 3.5–5.1)
Sodium: 138 mmol/L (ref 135–145)
Total Bilirubin: 0.9 mg/dL (ref 0.3–1.2)
Total Protein: 7.8 g/dL (ref 6.5–8.1)

## 2022-07-12 LAB — URINALYSIS, ROUTINE W REFLEX MICROSCOPIC
Bilirubin Urine: NEGATIVE
Glucose, UA: 50 mg/dL — AB
Hgb urine dipstick: NEGATIVE
Ketones, ur: NEGATIVE mg/dL
Leukocytes,Ua: NEGATIVE
Nitrite: NEGATIVE
Protein, ur: 30 mg/dL — AB
Specific Gravity, Urine: 1.043 — ABNORMAL HIGH (ref 1.005–1.030)
Squamous Epithelial / HPF: NONE SEEN /HPF (ref 0–5)
pH: 5 (ref 5.0–8.0)

## 2022-07-12 LAB — LIPASE, BLOOD: Lipase: 33 U/L (ref 11–51)

## 2022-07-12 LAB — CBC WITH DIFFERENTIAL/PLATELET
Abs Immature Granulocytes: 0.06 10*3/uL (ref 0.00–0.07)
Basophils Absolute: 0.1 10*3/uL (ref 0.0–0.1)
Basophils Relative: 1 %
Eosinophils Absolute: 0.2 10*3/uL (ref 0.0–0.5)
Eosinophils Relative: 2 %
HCT: 46.6 % (ref 39.0–52.0)
Hemoglobin: 15.6 g/dL (ref 13.0–17.0)
Immature Granulocytes: 1 %
Lymphocytes Relative: 24 %
Lymphs Abs: 2.7 10*3/uL (ref 0.7–4.0)
MCH: 32.4 pg (ref 26.0–34.0)
MCHC: 33.5 g/dL (ref 30.0–36.0)
MCV: 96.9 fL (ref 80.0–100.0)
Monocytes Absolute: 0.7 10*3/uL (ref 0.1–1.0)
Monocytes Relative: 6 %
Neutro Abs: 7.5 10*3/uL (ref 1.7–7.7)
Neutrophils Relative %: 66 %
Platelets: 277 10*3/uL (ref 150–400)
RBC: 4.81 MIL/uL (ref 4.22–5.81)
RDW: 13.5 % (ref 11.5–15.5)
WBC: 11.2 10*3/uL — ABNORMAL HIGH (ref 4.0–10.5)
nRBC: 0 % (ref 0.0–0.2)

## 2022-07-12 SURGERY — APPENDECTOMY, ROBOT-ASSISTED, LAPAROSCOPIC
Anesthesia: General | Site: Abdomen

## 2022-07-12 MED ORDER — DEXMEDETOMIDINE HCL IN NACL 80 MCG/20ML IV SOLN
INTRAVENOUS | Status: DC | PRN
  Administered 2022-07-12: 8 ug via INTRAVENOUS

## 2022-07-12 MED ORDER — LIDOCAINE HCL (CARDIAC) PF 100 MG/5ML IV SOSY
PREFILLED_SYRINGE | INTRAVENOUS | Status: DC | PRN
  Administered 2022-07-12: 100 mg via INTRAVENOUS

## 2022-07-12 MED ORDER — DEXAMETHASONE SODIUM PHOSPHATE 10 MG/ML IJ SOLN
INTRAMUSCULAR | Status: AC
  Filled 2022-07-12: qty 1

## 2022-07-12 MED ORDER — FENTANYL CITRATE (PF) 100 MCG/2ML IJ SOLN: 25.0000 ug | INTRAMUSCULAR | Status: DC | PRN

## 2022-07-12 MED ORDER — BUPIVACAINE HCL (PF) 0.5 % IJ SOLN
INTRAMUSCULAR | Status: AC
  Filled 2022-07-12: qty 30

## 2022-07-12 MED ORDER — 0.9 % SODIUM CHLORIDE (POUR BTL) OPTIME
TOPICAL | Status: DC | PRN
  Administered 2022-07-12: 10 mL

## 2022-07-12 MED ORDER — SODIUM CHLORIDE 0.9 % IV SOLN: INTRAVENOUS | Status: DC

## 2022-07-12 MED ORDER — LIDOCAINE-EPINEPHRINE (PF) 1 %-1:200000 IJ SOLN
INTRAMUSCULAR | Status: AC
  Filled 2022-07-12: qty 30

## 2022-07-12 MED ORDER — ROCURONIUM BROMIDE 100 MG/10ML IV SOLN
INTRAVENOUS | Status: DC | PRN
  Administered 2022-07-12: 70 mg via INTRAVENOUS

## 2022-07-12 MED ORDER — OXYCODONE HCL 5 MG PO TABS: 5.0000 mg | ORAL_TABLET | Freq: Once | ORAL | Status: DC | PRN

## 2022-07-12 MED ORDER — TRAMADOL HCL 50 MG PO TABS: 50.0000 mg | ORAL_TABLET | Freq: Four times a day (QID) | ORAL | Status: DC | PRN

## 2022-07-12 MED ORDER — FENTANYL CITRATE (PF) 100 MCG/2ML IJ SOLN
INTRAMUSCULAR | Status: DC | PRN
  Administered 2022-07-12 (×4): 50 ug via INTRAVENOUS

## 2022-07-12 MED ORDER — SODIUM CHLORIDE 0.9 % IV SOLN
2.0000 g | INTRAVENOUS | Status: DC
  Administered 2022-07-12: 2 g via INTRAVENOUS
  Filled 2022-07-12: qty 20

## 2022-07-12 MED ORDER — ENOXAPARIN SODIUM 40 MG/0.4ML IJ SOSY: 40.0000 mg | PREFILLED_SYRINGE | INTRAMUSCULAR | Status: DC

## 2022-07-12 MED ORDER — MUPIROCIN 2 % EX OINT
1.0000 | TOPICAL_OINTMENT | Freq: Two times a day (BID) | CUTANEOUS | Status: DC
  Filled 2022-07-12 (×2): qty 22

## 2022-07-12 MED ORDER — DOCUSATE SODIUM 100 MG PO CAPS: 100.0000 mg | ORAL_CAPSULE | Freq: Two times a day (BID) | ORAL | Status: DC | PRN

## 2022-07-12 MED ORDER — PROPOFOL 10 MG/ML IV BOLUS
INTRAVENOUS | Status: DC | PRN
  Administered 2022-07-12: 200 mg via INTRAVENOUS

## 2022-07-12 MED ORDER — METRONIDAZOLE 500 MG/100ML IV SOLN
500.0000 mg | Freq: Two times a day (BID) | INTRAVENOUS | Status: DC
  Administered 2022-07-12: 500 mg via INTRAVENOUS
  Filled 2022-07-12 (×2): qty 100

## 2022-07-12 MED ORDER — FENTANYL CITRATE (PF) 100 MCG/2ML IJ SOLN
INTRAMUSCULAR | Status: AC
  Filled 2022-07-12: qty 2

## 2022-07-12 MED ORDER — ABACAVIR-DOLUTEGRAVIR-LAMIVUD 600-50-300 MG PO TABS
1.0000 | ORAL_TABLET | Freq: Every day | ORAL | Status: DC
  Administered 2022-07-13: 1 via ORAL
  Filled 2022-07-12: qty 1

## 2022-07-12 MED ORDER — DEXAMETHASONE SODIUM PHOSPHATE 10 MG/ML IJ SOLN
INTRAMUSCULAR | Status: DC | PRN
  Administered 2022-07-12: 5 mg via INTRAVENOUS

## 2022-07-12 MED ORDER — MIDAZOLAM HCL 2 MG/2ML IJ SOLN
INTRAMUSCULAR | Status: DC | PRN
  Administered 2022-07-12: 2 mg via INTRAVENOUS

## 2022-07-12 MED ORDER — IOHEXOL 300 MG/ML  SOLN
100.0000 mL | Freq: Once | INTRAMUSCULAR | Status: AC | PRN
  Administered 2022-07-12: 100 mL via INTRAVENOUS

## 2022-07-12 MED ORDER — KETOROLAC TROMETHAMINE 30 MG/ML IJ SOLN
15.0000 mg | Freq: Once | INTRAMUSCULAR | Status: AC
  Administered 2022-07-12: 15 mg via INTRAVENOUS
  Filled 2022-07-12: qty 1

## 2022-07-12 MED ORDER — LIDOCAINE HCL (PF) 2 % IJ SOLN
INTRAMUSCULAR | Status: AC
  Filled 2022-07-12: qty 5

## 2022-07-12 MED ORDER — MIDAZOLAM HCL 2 MG/2ML IJ SOLN
INTRAMUSCULAR | Status: AC
  Filled 2022-07-12: qty 2

## 2022-07-12 MED ORDER — ONDANSETRON HCL 4 MG/2ML IJ SOLN
INTRAMUSCULAR | Status: AC
  Filled 2022-07-12: qty 2

## 2022-07-12 MED ORDER — HYDROCODONE-ACETAMINOPHEN 5-325 MG PO TABS
1.0000 | ORAL_TABLET | ORAL | Status: DC | PRN
  Administered 2022-07-12: 1 via ORAL
  Filled 2022-07-12: qty 1

## 2022-07-12 MED ORDER — ROCURONIUM BROMIDE 10 MG/ML (PF) SYRINGE
PREFILLED_SYRINGE | INTRAVENOUS | Status: AC
  Filled 2022-07-12: qty 10

## 2022-07-12 MED ORDER — OXYCODONE HCL 5 MG/5ML PO SOLN: 5.0000 mg | Freq: Once | ORAL | Status: DC | PRN

## 2022-07-12 MED ORDER — LIDOCAINE-EPINEPHRINE (PF) 1 %-1:200000 IJ SOLN
INTRAMUSCULAR | Status: DC | PRN
  Administered 2022-07-12: 15 mL via INTRAMUSCULAR

## 2022-07-12 MED ORDER — ONDANSETRON 4 MG PO TBDP: 4.0000 mg | ORAL_TABLET | Freq: Four times a day (QID) | ORAL | Status: DC | PRN

## 2022-07-12 MED ORDER — MORPHINE SULFATE (PF) 2 MG/ML IV SOLN: 2.0000 mg | INTRAVENOUS | Status: DC | PRN

## 2022-07-12 MED ORDER — ONDANSETRON HCL 4 MG/2ML IJ SOLN
4.0000 mg | Freq: Four times a day (QID) | INTRAMUSCULAR | Status: DC | PRN
  Administered 2022-07-12: 4 mg via INTRAVENOUS

## 2022-07-12 MED ORDER — SUGAMMADEX SODIUM 200 MG/2ML IV SOLN
INTRAVENOUS | Status: DC | PRN
  Administered 2022-07-12: 200 mg via INTRAVENOUS

## 2022-07-12 SURGICAL SUPPLY — 60 items
ADH SKN CLS APL DERMABOND .7 (GAUZE/BANDAGES/DRESSINGS) ×1
ANCHOR TIS RET SYS 235ML (MISCELLANEOUS) ×1 IMPLANT
BAG PRESSURE INF REUSE 1000 (BAG) IMPLANT
BAG TISS RTRVL C235 10X14 (MISCELLANEOUS) ×1
BLADE SURG SZ11 CARB STEEL (BLADE) ×1 IMPLANT
BULB RESERV EVAC DRAIN JP 100C (MISCELLANEOUS) IMPLANT
COVER TIP SHEARS 8 DVNC (MISCELLANEOUS) ×1 IMPLANT
DERMABOND ADVANCED .7 DNX12 (GAUZE/BANDAGES/DRESSINGS) ×1 IMPLANT
DRAIN CHANNEL JP 15F RND 16 (MISCELLANEOUS) IMPLANT
DRAPE ARM DVNC X/XI (DISPOSABLE) ×3 IMPLANT
DRAPE COLUMN DVNC XI (DISPOSABLE) ×1 IMPLANT
ELECT CAUTERY BLADE 6.4 (BLADE) IMPLANT
ELECT REM PT RETURN 9FT ADLT (ELECTROSURGICAL) ×1
ELECTRODE REM PT RTRN 9FT ADLT (ELECTROSURGICAL) ×1 IMPLANT
FORCEPS BPLR 8 MD DVNC XI (FORCEP) ×1 IMPLANT
FORCEPS BPLR FENES DVNC XI (FORCEP) ×1 IMPLANT
GLOVE BIOGEL PI IND STRL 7.0 (GLOVE) ×2 IMPLANT
GLOVE SURG SYN 6.5 ES PF (GLOVE) ×3 IMPLANT
GLOVE SURG SYN 6.5 PF PI (GLOVE) ×2 IMPLANT
GOWN STRL REUS W/ TWL LRG LVL3 (GOWN DISPOSABLE) ×3 IMPLANT
GOWN STRL REUS W/TWL LRG LVL3 (GOWN DISPOSABLE) ×3
GRASPER SUT TROCAR 14GX15 (MISCELLANEOUS) IMPLANT
IRRIGATOR SUCT 8 DISP DVNC XI (IRRIGATION / IRRIGATOR) IMPLANT
IV NS 1000ML (IV SOLUTION)
IV NS 1000ML BAXH (IV SOLUTION) IMPLANT
JACKSON PRATT 7MM (INSTRUMENTS) IMPLANT
KIT TURNOVER KIT A (KITS) ×1 IMPLANT
LABEL OR SOLS (LABEL) IMPLANT
MANIFOLD NEPTUNE II (INSTRUMENTS) ×1 IMPLANT
NDL HYPO 22X1.5 SAFETY MO (MISCELLANEOUS) ×1 IMPLANT
NDL INSUFFLATION 14GA 120MM (NEEDLE) ×1 IMPLANT
NEEDLE HYPO 22X1.5 SAFETY MO (MISCELLANEOUS) ×1 IMPLANT
NEEDLE INSUFFLATION 14GA 120MM (NEEDLE) ×1 IMPLANT
OBTURATOR OPTICAL STND 8 DVNC (TROCAR) ×1
OBTURATOR OPTICALSTD 8 DVNC (TROCAR) ×1 IMPLANT
PACK LAP CHOLECYSTECTOMY (MISCELLANEOUS) ×1 IMPLANT
PENCIL SMOKE EVACUATOR (MISCELLANEOUS) ×1 IMPLANT
RELOAD STAPLE 45 2.5 WHT DVNC (STAPLE) IMPLANT
RELOAD STAPLE 45 3.5 BLU DVNC (STAPLE) ×1 IMPLANT
RELOAD STAPLER 2.5X45 WHT DVNC (STAPLE) ×1 IMPLANT
RELOAD STAPLER 3.5X45 BLU DVNC (STAPLE) ×1 IMPLANT
SCISSORS MNPLR CVD DVNC XI (INSTRUMENTS) ×1 IMPLANT
SEAL UNIV 5-12 XI (MISCELLANEOUS) ×3 IMPLANT
SET TUBE SMOKE EVAC HIGH FLOW (TUBING) ×1 IMPLANT
SOL ELECTROSURG ANTI STICK (MISCELLANEOUS) ×1
SOLUTION ELECTROSURG ANTI STCK (MISCELLANEOUS) ×1 IMPLANT
STAPLER 45 SUREFORM DVNC (STAPLE) ×1 IMPLANT
STAPLER RELOAD 2.5X45 WHT DVNC (STAPLE) ×1
STAPLER RELOAD 3.5X45 BLU DVNC (STAPLE) ×1
SUT ETHILON 3-0 FS-10 30 BLK (SUTURE)
SUT MNCRL AB 4-0 PS2 18 (SUTURE) ×1 IMPLANT
SUT VIC AB 3-0 SH 27 (SUTURE)
SUT VIC AB 3-0 SH 27X BRD (SUTURE) IMPLANT
SUT VICRYL 0 UR6 27IN ABS (SUTURE) ×1 IMPLANT
SUTURE EHLN 3-0 FS-10 30 BLK (SUTURE) IMPLANT
SYR 30ML LL (SYRINGE) ×1 IMPLANT
SYSTEM WECK SHIELD CLOSURE (TROCAR) IMPLANT
TRAP FLUID SMOKE EVACUATOR (MISCELLANEOUS) ×1 IMPLANT
TRAY FOLEY MTR SLVR 16FR STAT (SET/KITS/TRAYS/PACK) ×1 IMPLANT
WATER STERILE IRR 500ML POUR (IV SOLUTION) ×1 IMPLANT

## 2022-07-12 NOTE — ED Provider Notes (Signed)
[facility] Jason Townsend    CSN: [id number] Arrival date & time: 07/12/22  1113      History   Chief Complaint Chief Complaint  Patient presents with   Abdominal Pain    HPI Jason Townsend is a 62 y.o. male.    Abdominal Pain   Presents to urgent care with complaint of right mid abdominal pain x 2 days.    He states he initially believed he was experiencing constipation and used Dulcolax as well as an enema without improvement.  States no response to Dulcolax and only watery output with the enema.  Patient is concerned about possible intestinal blockage but thinks the discomfort is too high in his abdomen to be related to appendix.    Endorses reduced appetite but tolerated eating pizza today (1 slice) with only minor nausea.  Reports tolerating fluids.  He denies any diet changes or change in medication.  He reports nausea and watery diarrhea.  Not passing any normal stools since start of symptoms.  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   Unilateral primary osteoarthritis, left hip 05/15/2017   HIV disease (HCC) 02/22/2017   Hyperlipidemia 02/22/2017   AVN (avascular necrosis of bone) (HCC) 02/22/2017   Syphilis 02/22/2017   Depression 02/22/2017   Hepatitis A immune 02/08/2017   Hepatitis B immune 02/06/2017    Past Surgical History:  Procedure Laterality Date   CATARACT EXTRACTION Left    TOTAL  HIP ARTHROPLASTY Left 06/23/2017   Procedure: LEFT TOTAL HIP ARTHROPLASTY ANTERIOR APPROACH;  Surgeon: Kathryne Hitch, MD;  Location: [facility];  Service: Orthopedics;  Laterality: Left;   wisdom teeth extraction  1982   YAG LASER APPLICATION         Home Medications    Prior to Admission medications   Medication Sig Start Date End Date Taking? Authorizing Provider  atorvastatin (LIPITOR) 10 MG tablet TAKE 1 TABLET BY MOUTH DAILY 07/01/22   Vu, Trung T, MD  azithromycin (ZITHROMAX) 250 MG tablet Take 1 tablet (250 mg total) by mouth daily. Take first 2 tablets together, then 1 every day until finished. 05/27/22   Radford Pax, NP  Cholecalciferol (VITAMIN D3) 125 MCG (5000 UT) CAPS  09/25/18   [provider]  loratadine (CLARITIN) 10 MG tablet  09/25/18   [provider]  Multiple Vitamins-Minerals (THERA-M) TABS Take 1 tablet by mouth daily.     [provider]  promethazine-dextromethorphan (PROMETHAZINE-DM) 6.25-15 MG/5ML syrup Take 5 mLs by mouth 4 (four) times daily as needed for cough. 05/27/22   Radford Pax, NP  sertraline (ZOLOFT) 25 MG tablet TAKE 1 TABLET BY MOUTH  DAILY Patient not taking: Reported on 08/31/2021 11/27/20   Ginnie Smart, MD  TRIUMEQ 600-50-300 MG tablet TAKE 1 TABLET BY MOUTH DAILY 03/04/22   Kathlynn Grate, DO  Zinc 50 MG TABS  08/25/18   [provider]    Family History Family History  Problem Relation Age of Onset   Dementia Father    Colon cancer Neg Hx    Colon polyps Neg Hx    Esophageal cancer Neg Hx    Stomach cancer Neg Hx    Rectal cancer Neg Hx     Social History Social History   Tobacco Use   Smoking status: Every Day    Packs/day: 0.50    Years: 20.00    Additional pack years: 0.00    Total pack years: 10.00    Types: Cigarettes   Smokeless tobacco: Never   Tobacco comments:    talking about it     Smoking 15 cigs per day  Vaping Use   Vaping Use: Former  Substance Use Topics   Alcohol  use: Yes    Comment: 1 bourbon a day   Drug use: No    Comment: methadone last use end of 2017     Allergies   Patient has no known allergies.   Review of Systems Review of Systems  Gastrointestinal:  Positive for abdominal pain.     Physical Exam Triage Vital Signs ED Triage Vitals  Enc Vitals Group     BP 07/12/22 1227 130/84     Pulse Rate 07/12/22 1227 97     Resp 07/12/22 1227 18     Temp 07/12/22 1227 99 F (37.2 C)     Temp Source 07/12/22 1227 Oral     SpO2 07/12/22 1227 95 %     Weight --      Height --      Head Circumference --      Peak Flow --      Pain Score 07/12/22 1230 5     Pain Loc --      Pain Edu? --      Excl. in GC? --    No data found.  Updated Vital Signs BP 130/84 (BP Location: Left Arm)   Pulse 97   Temp 99 F (37.2 C) (Oral)   Resp 18   SpO2 95%   Visual Acuity Right Eye Distance:   Left Eye Distance:   Bilateral Distance:    Right Eye Near:   Left Eye Near:    Bilateral Near:     Physical Exam Vitals reviewed.  Constitutional:      Appearance: He is well-developed. He is not ill-appearing.  Abdominal:     Tenderness: There is abdominal tenderness in the right lower quadrant. There is guarding. Positive signs include McBurney's sign. Negative signs include Rovsing's sign.     Hernia: A hernia is present. Hernia is present in the umbilical area.  Skin:    General: Skin is warm and dry.  Neurological:     General: No focal deficit present.     Mental Status: He is alert and oriented to person, place, and time.  Psychiatric:        Mood and Affect: Mood normal.        Behavior: Behavior normal.      UC Treatments / Results  Labs (all labs ordered are listed, but only abnormal results are displayed) Labs Reviewed - No data to display  EKG   Radiology No results found.  Procedures Procedures (including critical care time)  Medications Ordered in UC Medications - No data to display  Initial Impression /  Assessment and Plan / UC Course  I have reviewed the triage vital signs and the nursing notes.  Pertinent labs & imaging results that were available during my care of the patient were reviewed by me and considered in my medical decision making (see chart for details).   Jason Townsend is a 62 y.o. male presenting with RLQ tenderness. Patient is afebrile without recent antipyretics, satting well on room air.   On exam, Overall is well appearing, well hydrated, without respiratory distress. Pulmonary exam is unremarkable.  Lungs CTAB without wheezing, rhonchi, rales. RRR.  No left-sided tenderness, rebound tenderness or Rosing sign.  Guarding with palpation in the right lower quadrant with exquisite tenderness with light palpation expressed by patient at McBurney's point.  There is a small umbilical hernia present which the patient states is his baseline.  Reviewed relevant chart history.   Explained to the patient that when he scheduled the appointment and when he presented for registration today that we would not be able to perform abdominal imaging should it be determined it is advisable.  Patient acknowledged this and requested evaluation.  Differential diagnoses include but not limited to appendicitis, other intestinal (small bowel) obstruction, severe constipation.  I am somewhat concerned that the patient had no response to Dulcolax nor anything but watery output with the enema.  Reassured that the patient is experiencing only minor nausea with no vomiting and reportedly tolerating fluids and some food today.  However, concerned that the patient presents with right lower quadrant guarding with light palpation at McBurney's point.  Recommended the patient be transferred to a facility where at least abdominal x-ray could be performed to rule out constipation, but suggested that he go to the ED where advanced imaging was available if necessary for small bowel obstruction rule out or  appendicitis.  Counseled patient on potential for adverse effects with medications prescribed/recommended today, ER and return-to-clinic precautions discussed, patient verbalized understanding and agreement with care plan.  Final Clinical Impressions(s) / UC Diagnoses   Final diagnoses:  None   Discharge Instructions   None    ED Prescriptions   None    PDMP not reviewed this encounter.   Charma Igo, [place] 07/12/22 1313

## 2022-07-12 NOTE — H&P (Signed)
Subjective:   CC: Acute appendicitis  HPI:  Jason Townsend is a 62 y.o. male who is consulted by Ray for evaluation of  above cc.  Symptoms were first noted 2 days ago. Pain is sharp, localized to right lower quadrant associated with nothing, exacerbated by nothing     Past Medical History:  has a past medical history of Allergy, Arthritis, AVN (avascular necrosis of bone) (HCC), Depression, HIV (human immunodeficiency virus infection) (HCC) (08/2010), Hyperlipidemia, 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) (end of 2017), and Syphilis.  Past Surgical History:  has a past surgical history that includes Cataract extraction (Left); wisdom teeth extraction (1982); Total hip arthroplasty (Left, 06/23/2017); and Yag laser application.  Family History: family history includes Dementia in his father.  Social History:  reports that he has been smoking cigarettes. He has a 10.00 pack-year smoking history. He has never used smokeless tobacco. He reports current alcohol use of about 7.0 standard drinks of alcohol per week. He reports that he does not use drugs.  Current Medications:  Prior to Admission medications   Medication Sig Start Date End Date Taking? Authorizing Provider  atorvastatin (LIPITOR) 10 MG tablet TAKE 1 TABLET BY MOUTH DAILY 07/01/22   Vu, Trung T, MD  azithromycin (ZITHROMAX) 250 MG tablet Take 1 tablet (250 mg total) by mouth daily. Take first 2 tablets together, then 1 every day until finished. 05/27/22   Radford Pax, NP  Cholecalciferol (VITAMIN D3) 125 MCG (5000 UT) CAPS  09/25/18   [provider]  loratadine (CLARITIN) 10 MG tablet  09/25/18   [provider]  Multiple Vitamins-Minerals (THERA-M) TABS Take 1 tablet by mouth daily.     [provider]  promethazine-dextromethorphan (PROMETHAZINE-DM) 6.25-15 MG/5ML syrup Take 5 mLs by mouth 4 (four) times daily as needed for cough. 05/27/22   Radford Pax, NP  sertraline (ZOLOFT) 25 MG tablet TAKE 1  TABLET BY MOUTH  DAILY Patient not taking: Reported on 08/31/2021 11/27/20   Ginnie Smart, MD  TRIUMEQ 600-50-300 MG tablet TAKE 1 TABLET BY MOUTH DAILY 03/04/22   Kathlynn Grate, DO  Zinc 50 MG TABS  08/25/18   [provider]    Allergies:  Allergies as of 07/12/2022   (No Known Allergies)    ROS:  General: Denies weight loss, weight gain, fatigue, fevers, chills, and night sweats. Eyes: Denies blurry vision, double vision, eye pain, itchy eyes, and tearing. Ears: Denies hearing loss, earache, and ringing in ears. Nose: Denies sinus pain, congestion, infections, runny nose, and nosebleeds. Mouth/throat: Denies hoarseness, sore throat, bleeding gums, and difficulty swallowing. Heart: Denies chest pain, palpitations, racing heart, irregular heartbeat, leg pain or swelling, and decreased activity tolerance. Respiratory: Denies breathing difficulty, shortness of breath, wheezing, cough, and sputum. GI: Denies change in appetite, heartburn, nausea, vomiting, constipation, diarrhea, and blood in stool. GU: Denies difficulty urinating, pain with urinating, urgency, frequency, blood in urine. Musculoskeletal: Denies joint stiffness, pain, swelling, muscle weakness. Skin: Denies rash, itching, mass, tumors, sores, and boils Neurologic: Denies headache, fainting, dizziness, seizures, numbness, and tingling. Psychiatric: Denies depression, anxiety, difficulty sleeping, and memory loss. Endocrine: Denies heat or cold intolerance, and increased thirst or urination. Blood/lymph: Denies easy bruising, easy bruising, and swollen glands     Objective:     BP (!) 130/93 (BP Location: Left Arm)   Pulse 81   Temp 98.5 F (36.9 C) (Oral)   Resp 16   Ht 6\' 2"  (1.88 m)   Wt 94.3  kg   SpO2 100%   BMI 26.71 kg/m    Constitutional :  alert, cooperative, appears stated age, and no distress  Lymphatics/Throat:  no asymmetry, masses, or scars  Respiratory:  clear to auscultation  bilaterally  Cardiovascular:  regular rate and rhythm  Gastrointestinal: Soft, no guarding, focal tenderness to palpation right lower quadrant .   Musculoskeletal: Steady gait and movement  Skin: Cool and moist  Psychiatric: Normal affect, non-agitated, not confused       LABS:     Latest Ref Rng & Units 07/12/2022    1:38 PM 05/18/2022    3:08 PM 08/31/2021   10:51 AM  CMP  Glucose 70 - 99 mg/dL 161  90  096   BUN 6 - 20 mg/dL 17  14  15    Creatinine 0.61 - 1.24 mg/dL 0.45  4.09  8.11   Sodium 135 - 145 mmol/L 138  139  140   Potassium 3.5 - 5.1 mmol/L 4.0  4.8  4.4   Chloride 98 - 111 mmol/L 104  101  104   CO2 22 - 32 mmol/L 20  20  27    Calcium 8.9 - 10.3 mg/dL 9.6  91.4  9.8   Total Protein 6.5 - 8.1 g/dL 7.8  7.4  7.3   Total Bilirubin 0.3 - 1.2 mg/dL 0.9  0.3  0.4   Alkaline Phos 38 - 126 U/L 81  108    AST 15 - 41 U/L 19  21  19    ALT 0 - 44 U/L 26  34  31       Latest Ref Rng & Units 07/12/2022    1:38 PM 05/18/2022    3:08 PM 08/31/2021   10:51 AM  CBC  WBC 4.0 - 10.5 K/uL 11.2  6.1  8.0   Hemoglobin 13.0 - 17.0 g/dL 78.2  95.6  21.3   Hematocrit 39.0 - 52.0 % 46.6  46.8  45.6   Platelets 150 - 400 K/uL 277  314  298      RADS: Narrative & Impression  CLINICAL DATA:  Right lower quadrant pain and nausea for 2-1/2 days   EXAM: CT ABDOMEN AND PELVIS WITH CONTRAST   TECHNIQUE: Multidetector CT imaging of the abdomen and pelvis was performed using the standard protocol following bolus administration of intravenous contrast.   RADIATION DOSE REDUCTION: This exam was performed according to the departmental dose-optimization program which includes automated exposure control, adjustment of the mA and/or kV according to patient size and/or use of iterative reconstruction technique.   CONTRAST:  OMNIPAQUE IOHEXOL 300 MG/ML  SOLN   COMPARISON:  None Available.   FINDINGS: Lower chest: There is some linear opacity lung bases likely scar or atelectasis. No  pleural effusion.   Hepatobiliary: Diffuse fatty liver infiltration. No space-occupying liver lesion. Gallbladder is contracted. Patent portal vein.   Pancreas: Unremarkable. No pancreatic ductal dilatation or surrounding inflammatory changes.   Spleen: Normal in size without focal abnormality.   Adrenals/Urinary Tract: Stable right adrenal adenoma as seen on a CT scan of the chest from 04/22 22 measuring 22 mm. Preserved left adrenal gland. No enhancing renal mass. There is a 5 mm low-attenuation lesion in the right mid kidney posteriorly, too small to completely characterize but likely benign Bosniak 2 cysts. No specific imaging follow-up. No collecting system dilatation. The ureters have normal course and caliber extending down to the bladder. Preserved contours of the urinary bladder.   Stomach/Bowel: On this non  oral contrast exam, the stomach is nondilated. Mild fluid in the stomach. Small bowel has a normal course and caliber. Large bowel is of normal course and caliber with mild scattered colonic stool more on the right side than left. There is inflammatory stranding in the area of the enlarged appendix extending posterior to the cecum in the right lower quadrant. Diameter of the appendix measures 12 mm. Please correlate for acute appendicitis. No free air, free fluid. No signs of obstruction   Vascular/Lymphatic: Aortic atherosclerosis. No enlarged abdominal or pelvic lymph nodes.   Reproductive: Prostate is unremarkable.   Other: Small fat containing inguinal and umbilical hernias.   Musculoskeletal: Mild degenerative changes along the spine and pelvis. Disc bulging seen at L4-5 with some canal encroachment. Streak artifact related to the patient's left hip arthroplasty.   IMPRESSION: Enlarged appendix in the right lower quadrant with inflammatory changes consistent with a acute appendicitis. No complex features at this time.   Fatty liver infiltration.    Stable right adrenal adenoma.     Electronically Signed   By: Karen Kays M.D.   On: 07/12/2022 17:13     Assessment:      Acute appendicitis   Plan:      Discussed the risk of surgery including post-op infxn, seroma, hematoma, abscess formation, chronic pain, poor-delayed wound healing, possible bowel resection, possible ostomy, possible conversion to open procedure, post-op SBO or ileus, and need for additional procedures to address said risks.  The risks of general anesthetic including MI, CVA, sudden death or even reaction to anesthetic medications also discussed. Alternatives include continued observation, or antibiotic treatment.  Benefits include possible symptom relief,   Typical post operative recovery of 3-5 days rest, also discussed.  The patient understands the risks, any and all questions were answered to the patient's satisfaction.  2 OR for robotic assisted laparoscopic appendectomy.  IV fluids,  n.p.o.,  IV antibiotics in the meantime  labs/images/medications/previous chart entries reviewed personally and relevant changes/updates noted above.

## 2022-07-12 NOTE — Op Note (Signed)
Preoperative diagnosis: acute appendicitis  Postoperative diagnosis: Same  Procedure: Robotic assisted laparoscopic appendectomy.  Anesthesia: GETA  Surgeon: Sung Amabile  Wound Classification: clean contaminated  Specimen: Appendix  Complications: None  Estimated Blood Loss: 3 mL   Indications: Patient is a 62 y.o. male  presented with above.  Please see H&P for further details.    FIndings: 1.  Irritated appendix  2. No peri-appendiceal abscess or phlegmon 3. Normal anatomy 4. Appendiceal artery ligated and divided with stapler 5. Adequate hemostasis.   Description of procedure: The patient was placed on the operating table in the supine position, left arm tucked. General anesthesia was induced. A time-out was completed verifying correct patient, procedure, site, positioning, and implant(s) and/or special equipment prior to beginning this procedure. The abdomen was prepped and draped in the usual sterile fashion.   Palmer's point located and Veress needle was inserted.  After confirming 2 clicks and a positive saline drop test, gas insufflation was initiated until the abdominal pressure was measured at 15 mmHg.  Afterwards, the Veress needle was removed and a 8 mm port was placed through a periumbilical site using Optiview technique after incision with an 11 blade.  After local was infused, 2 additional incision was made 8 cm apart each side along the left side of the abdominal wall from the initial incision.  An 8 mm port was caudaed and 12mm port cephalad from initial incision, both under direct visualization.  No injuries from trocar placements were noted. The table was placed in the Trendelenburg position with the right side elevated.  Xi robotic platform was then brought to the operative field and docked.  An inflamed appendix was identified and elevated.  Infection was present within the abdominal cavity due to appendicitis. Window created at base of appendix in the mesentery.    A blue load linear cutting stapler was then used to divide and staple the base of the appendix. It was reloaded with a vascular cartridge and the mesoappendix similarly divided.  No bleeding from the staple lines noted.  The appendix was placed in an endoscopic retrieval bag and removed.   The appendiceal stump and mesoappendix staple line examined again and hemostasis noted. No other pathology was identified within pelvis. The 12 mm trocar removed and port site closed with PMI using 0 vicryl under direct vision. Remaining trocars were removed under direct vision. No bleeding was noted.The abdomen was allowed to collapse. 3-0 vicryl interrupted used to close 12mm port site at dermal level, and then all skin incisions then closed with subcuticular sutures Monocryl 4-0.  Wounds then dressed with dermabond.  The patient tolerated the procedure well, awakened from anesthesia and was taken to the postanesthesia care unit in satisfactory condition.  Sponge count and instrument count correct at the end of the procedure.

## 2022-07-12 NOTE — ED Triage Notes (Signed)
Right mid abdominal pain starting Sunday he initially believed was constipation. Used dulcolax and an enema without improvement. Reports he's unsure if it's a blockage, thinks it's placed too high in his abdomen for it to be his appendix. Denies diet changes, medication uses. Reports nausea and watery diarrhea. States he's not passed any normal stools since the start of symptoms.

## 2022-07-12 NOTE — ED Notes (Signed)
ED TO INPATIENT HANDOFF REPORT  ED Nurse Name and Phone #: Angelique Holm, RN [phone]  S Name/Age/Gender Jason Townsend 62 y.o. male Room/Bed: ED16A/ED16A  Code Status   Code Status: Full Code  Home/SNF/Other Home Patient oriented to: self, place, time, and situation Is this baseline? Yes   Triage Complete: Triage complete  Chief Complaint Acute appendicitis [K35.80]  Triage Note Patient c/o abdominal pain with Nausea X 2.5 days. Reports diarrhea this AM.   Allergies No Known Allergies  Level of Care/Admitting Diagnosis ED Disposition     ED Disposition  Admit   Condition  --   Comment  Hospital Area: 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   Syphilis    treated   Past Surgical History:  Procedure Laterality Date   CATARACT EXTRACTION Left    TOTAL HIP ARTHROPLASTY Left 06/23/2017   Procedure: LEFT TOTAL HIP ARTHROPLASTY ANTERIOR APPROACH;  Surgeon: Kathryne Hitch, MD;  Location: [facility];  Service: Orthopedics;  Laterality: Left;   wisdom teeth extraction  1982   YAG LASER APPLICATION       A IV Location/Drains/Wounds Patient Lines/Drains/Airways Status     Active Line/Drains/Airways     Name Placement date Placement time Site Days   Peripheral IV 07/12/22 20 G Right Antecubital 07/12/22  1547  Antecubital  less than 1             Intake/Output Last 24 hours No intake or output data in the 24 hours ending 07/12/22 1941  Labs/Imaging Results for orders placed or performed during the hospital encounter of 07/12/22 (from the past 48 hour(s))  CBC with Differential     Status: Abnormal   Collection Time: 07/12/22  1:38 PM  Result Value Ref Range   WBC 11.2 (H) 4.0 - 10.5 K/uL   RBC 4.81 4.22 - 5.81 MIL/uL   Hemoglobin 15.6 13.0 - 17.0 g/dL   HCT 52.8 41.3 - 24.4 %   MCV 96.9 80.0 - 100.0 fL   MCH 32.4 26.0 - 34.0 pg   MCHC 33.5 30.0 - 36.0 g/dL   RDW 01.0 27.2 - 53.6 %   Platelets 277 150 - 400 K/uL   nRBC 0.0 0.0 - 0.2 %   Neutrophils Relative % 66 %   Neutro Abs 7.5 1.7 - 7.7 K/uL   Lymphocytes Relative 24 %   Lymphs Abs 2.7 0.7 - 4.0 K/uL   Monocytes Relative 6 %   Monocytes Absolute 0.7 0.1 - 1.0 K/uL   Eosinophils Relative 2 %   Eosinophils Absolute 0.2 0.0 - 0.5 K/uL   Basophils Relative 1 %   Basophils Absolute 0.1 0.0 - 0.1 K/uL   Immature Granulocytes 1 %   Abs Immature Granulocytes 0.06 0.00 - 0.07 K/uL    Comment: Performed at [facility], [address] [place],  [place] [address]  Comprehensive metabolic panel     Status: Abnormal   Collection Time: 07/12/22  1:38 PM  Result Value Ref Range   Sodium 138 135 - 145 mmol/L   Potassium 4.0 3.5 - 5.1 mmol/L   Chloride 104 98 - 111 mmol/L   CO2 20 (L) 22 - 32 mmol/L   Glucose, Bld 161 (H) 70 - 99 mg/dL    Comment: Glucose reference range applies only to samples taken after fasting for at least 8 hours.   BUN 17 6 - 20 mg/dL   Creatinine, Ser 0.98 0.61 - 1.24 mg/dL   Calcium 9.6 8.9 - 11.9 mg/dL   Total Protein 7.8 6.5 - 8.1 g/dL   Albumin 4.5 3.5 - 5.0 g/dL   AST 19 15 - 41 U/L   ALT 26 0 - 44 U/L   Alkaline Phosphatase 81 38 - 126 U/L   Total Bilirubin 0.9 0.3 - 1.2 mg/dL   GFR, Estimated >14 >78 mL/min    Comment: (NOTE) Calculated using the CKD-EPI Creatinine Equation (2021)    Anion gap 14 5 - 15    Comment:  Performed at [facility], [address] [place], [place] [address]  Lipase, blood     Status: None   Collection Time: 07/12/22  4:50 PM  Result Value Ref Range   Lipase 33 11 - 51 U/L    Comment: Performed at [facility], [address] [place], [place] [address]  Urinalysis, Routine w reflex microscopic -Urine, Clean Catch     Status: Abnormal   Collection Time: 07/12/22  5:13 PM  Result Value Ref Range   Color, Urine YELLOW (A) YELLOW   APPearance CLEAR (A) CLEAR   Specific Gravity, Urine 1.043 (H) 1.005 - 1.030   pH 5.0 5.0 - 8.0   Glucose, UA 50 (A) NEGATIVE mg/dL   Hgb urine dipstick NEGATIVE NEGATIVE   Bilirubin Urine NEGATIVE NEGATIVE   Ketones, ur NEGATIVE NEGATIVE mg/dL   Protein, ur 30 (A) NEGATIVE mg/dL   Nitrite NEGATIVE NEGATIVE   Leukocytes,Ua NEGATIVE NEGATIVE   RBC / HPF 0-5 0 - 5 RBC/hpf   WBC, UA 0-5 0 - 5 WBC/hpf   Bacteria, UA RARE (A) NONE SEEN   Squamous Epithelial / HPF NONE SEEN 0 - 5 /HPF   Mucus PRESENT     Comment: Performed at [facility], [address] [place], [place] [address]   CT ABDOMEN PELVIS W CONTRAST  Result Date: 07/12/2022 CLINICAL DATA:  Right lower quadrant pain and nausea for 2-1/2 days EXAM: CT ABDOMEN AND PELVIS WITH CONTRAST TECHNIQUE: Multidetector CT imaging of the abdomen and pelvis was performed using the standard protocol following bolus administration of intravenous contrast. RADIATION DOSE REDUCTION: This exam was performed according to the departmental dose-optimization program which includes automated exposure control, adjustment of the mA and/or kV according to patient size and/or use of iterative reconstruction technique. CONTRAST:  OMNIPAQUE IOHEXOL 300 MG/ML  SOLN COMPARISON:  None Available. FINDINGS: Lower chest: There is some linear opacity lung bases likely scar or atelectasis. No pleural effusion. Hepatobiliary: Diffuse fatty liver infiltration. No space-occupying liver lesion.  Gallbladder is contracted. Patent portal vein. Pancreas: Unremarkable. No pancreatic ductal dilatation or surrounding inflammatory changes. Spleen: Normal in size without focal abnormality. Adrenals/Urinary Tract: Stable right adrenal adenoma as seen on a CT scan of the chest from 04/22 22 measuring 22 mm. Preserved left adrenal gland. No enhancing renal mass. There is a 5 mm  low-attenuation lesion in the right mid kidney posteriorly, too small to completely characterize but likely benign Bosniak 2 cysts. No specific imaging follow-up. No collecting system dilatation. The ureters have normal course and caliber extending down to the bladder. Preserved contours of the urinary bladder. Stomach/Bowel: On this non oral contrast exam, the stomach is nondilated. Mild fluid in the stomach. Small bowel has a normal course and caliber. Large bowel is of normal course and caliber with mild scattered colonic stool more on the right side than left. There is inflammatory stranding in the area of the enlarged appendix extending posterior to the cecum in the right lower quadrant. Diameter of the appendix measures 12 mm. Please correlate for acute appendicitis. No free air, free fluid. No signs of obstruction Vascular/Lymphatic: Aortic atherosclerosis. No enlarged abdominal or pelvic lymph nodes. Reproductive: Prostate is unremarkable. Other: Small fat containing inguinal and umbilical hernias. Musculoskeletal: Mild degenerative changes along the spine and pelvis. Disc bulging seen at L4-5 with some canal encroachment. Streak artifact related to the patient's left hip arthroplasty. IMPRESSION: Enlarged appendix in the right lower quadrant with inflammatory changes consistent with a acute appendicitis. No complex features at this time. Fatty liver infiltration. Stable right adrenal adenoma. Electronically Signed   By: Karen Kays M.D.   On: 07/12/2022 17:13    Pending Labs [employer] (From admission, onward)     Start      Ordered   Signed and Held  CBC  (enoxaparin (LOVENOX)    CrCl >/= 30 ml/min)  Once,   R       Comments: Baseline for enoxaparin therapy IF NOT ALREADY DRAWN.  Notify MD if PLT < 100 K.    Signed and Held   Signed and Held  Creatinine, serum  (enoxaparin (LOVENOX)    CrCl >/= 30 ml/min)  Once,   R       Comments: Baseline for enoxaparin therapy IF NOT ALREADY DRAWN.    Signed and Held   Signed and Held  Creatinine, serum  (enoxaparin (LOVENOX)    CrCl >/= 30 ml/min)  Weekly,   R     Comments: while on enoxaparin therapy    Signed and Held            Vitals/Pain Today's Vitals   07/12/22 1645 07/12/22 1933 07/12/22 1934 07/12/22 1935  BP:  (!) 150/89    Pulse: 81 70    Resp:  16    Temp:    98.8 F (37.1 C)  TempSrc:    Oral  SpO2: 100% 98%    Weight:      Height:      PainSc:   6      Isolation Precautions No active isolations  Medications Medications  ketorolac (TORADOL) 30 MG/ML injection 15 mg (15 mg Intravenous Given 07/12/22 1549)  iohexol (OMNIPAQUE) 300 MG/ML solution 100 mL (100 mLs Intravenous Contrast Given 07/12/22 1630)    Mobility walks     Focused Assessments Patient alert and oriented.  Ambulates without assistance. Has had abdominal pain x 2 day with nausea.    R Recommendations: See Admitting Provider Note  Report given to:   Additional Notes: .

## 2022-07-12 NOTE — Anesthesia Preprocedure Evaluation (Signed)
Anesthesia Evaluation  Patient identified by MRN, date of birth, ID band Patient awake    Reviewed: Allergy & Precautions, NPO status , Patient's Chart, lab work & pertinent test results  Airway Mallampati: III  TM Distance: >3 FB Neck ROM: full    Dental  (+) Chipped, Dental Advidsory Given   Pulmonary neg pulmonary ROS, Current SmokerPatient did not abstain from smoking.   Pulmonary exam normal        Cardiovascular negative cardio ROS Normal cardiovascular exam     Neuro/Psych  PSYCHIATRIC DISORDERS      negative neurological ROS     GI/Hepatic negative GI ROS, Neg liver ROS,,,  Endo/Other  negative endocrine ROS    Renal/GU      Musculoskeletal   Abdominal   Peds  Hematology negative hematology ROS (+)   Anesthesia Other Findings Past Medical History: No date: Allergy No date: Arthritis     Comment:  oa left hip No date: AVN (avascular necrosis of bone) (HCC)     Comment:  L hip No date: Depression 08/2010: HIV (human immunodeficiency virus infection) (HCC)     Comment:  HLA (-), previous K103N No date: Hyperlipidemia end of 2017: 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) No date: Syphilis     Comment:  treated  Past Surgical History: No date: CATARACT EXTRACTION; Left 06/23/2017: TOTAL HIP ARTHROPLASTY; Left     Comment:  Procedure: LEFT TOTAL HIP ARTHROPLASTY ANTERIOR               APPROACH;  Surgeon: Kathryne Hitch, MD;                Location: [facility];  Service: Orthopedics;  Laterality:               Left; 1982: wisdom teeth extraction No date: YAG LASER APPLICATION  BMI    Body Mass Index: 26.71 kg/m      Reproductive/Obstetrics negative OB ROS                             Anesthesia Physical Anesthesia Plan  ASA: 2 and emergent  Anesthesia Plan: General ETT   Post-op Pain Management:    Induction: Intravenous  PONV Risk Score and Plan: 3 and  Ondansetron, Dexamethasone, Midazolam and Treatment may vary due to age or medical condition  Airway Management Planned: Oral ETT  Additional Equipment:   Intra-op Plan:   Post-operative Plan: Extubation in OR  Informed Consent: I have reviewed the patients History and Physical, chart, labs and discussed the procedure including the risks, benefits and alternatives for the proposed anesthesia with the patient or authorized representative who has indicated his/her understanding and acceptance.     Dental Advisory Given  Plan Discussed with: Anesthesiologist, CRNA and Surgeon  Anesthesia Plan Comments: (Patient consented for risks of anesthesia including but not limited to:  - adverse reactions to medications - damage to eyes, teeth, lips or other oral mucosa - nerve damage due to positioning  - sore throat or hoarseness - Damage to heart, brain, nerves, lungs, other parts of body or loss of life  Patient voiced understanding.)       Anesthesia Quick Evaluation

## 2022-07-12 NOTE — Discharge Instructions (Addendum)
Recommend transfer of care to a facility who can perform abdominal imaging including the ER in order to rule out more concerning causes for your symptoms.

## 2022-07-12 NOTE — ED Provider Triage Note (Signed)
Emergency Medicine Provider Triage Evaluation Note  Jason Townsend , a 62 y.o. male  was evaluated in triage.  Pt complains of abdominal pain for 2 days. Last normal BM 2 1/2 days ago.  Seen at [facility] and sent here. Last ate 1 hour ago.    Review of Systems  Positive: Nausea but not vomiting.  Took laxative and had watery stool.  Negative: No fever, chills  Physical Exam  There were no vitals taken for this visit. Gen:   Awake, no distress   Resp:  Normal effort  MSK:   Moves extremities without difficulty  Other:  RLQ moderate tenderness.    Medical Decision Making  Medically screening exam initiated at 1:19 PM.  Appropriate orders placed.  Myrna Blazer was informed that the remainder of the evaluation will be completed by another provider, this initial triage assessment does not replace that evaluation, and the importance of remaining in the ED until their evaluation is complete.     Tommi Rumps, PA-C 07/12/22 1322

## 2022-07-12 NOTE — ED Notes (Signed)
Patient is being discharged from the Urgent Care and sent to the Emergency Department via POV . Per S. Immordino NP, patient is in need of higher level of care due to need for abdominal imaging. Patient is aware and verbalizes understanding of plan of care.  Vitals:   07/12/22 1227  BP: 130/84  Pulse: 97  Resp: 18  Temp: 99 F (37.2 C)  SpO2: 95%

## 2022-07-12 NOTE — ED Triage Notes (Signed)
Patient c/o abdominal pain with Nausea X 2.5 days. Reports diarrhea this AM.

## 2022-07-12 NOTE — Anesthesia Procedure Notes (Signed)
Procedure Name: Intubation Date/Time: 07/12/2022 10:43 PM  Performed by: Elmarie Mainland, CRNAPre-anesthesia Checklist: Patient identified, Emergency Drugs available, Suction available and Patient being monitored Patient Re-evaluated:Patient Re-evaluated prior to induction Oxygen Delivery Method: Circle system utilized Preoxygenation: Pre-oxygenation with 100% oxygen Induction Type: IV induction Ventilation: Mask ventilation without difficulty and Oral airway inserted - appropriate to patient size Laryngoscope Size: McGraph and 4 Grade View: Grade I Tube type: Oral Tube size: 7.5 mm Number of attempts: 1 Airway Equipment and Method: Video-laryngoscopy and Stylet Placement Confirmation: ETT inserted through vocal cords under direct vision, positive ETCO2 and breath sounds checked- equal and bilateral Secured at: 23 cm Tube secured with: Tape Dental Injury: Teeth and Oropharynx as per pre-operative assessment

## 2022-07-12 NOTE — ED Provider Notes (Signed)
[facility] Provider Note    Event Date/Time   First MD Initiated Contact with Patient 07/12/22 1515     (approximate)   History   Abdominal Pain   HPI  Jason Townsend is a 62 y.o. male presenting to the emergency department for evaluation of abdominal pain.  On Sunday, patient had onset of right lower abdominal abdominal pain.  Has associated nausea without vomiting.  Last formed bowel movement was on Saturday, has had a small amount of watery output since then.  No fevers.  No history of similar.  No history of abdominal surgeries.  Denies dysuria, urinary frequency, testicular pain.      Physical Exam   Triage Vital Signs: ED Triage Vitals [07/12/22 1329]  Enc Vitals Group     BP (!) 151/100     Pulse Rate 97     Resp 16     Temp 97.8 F (36.6 C)     Temp Source Oral     SpO2 95 %     Weight 208 lb (94.3 kg)     Height 6\' 2"  (1.88 m)     Head Circumference      Peak Flow      Pain Score 5     Pain Loc      Pain Edu?      Excl. in GC?     Most recent vital signs: Vitals:   07/12/22 1935 07/12/22 2052  BP:  (!) 162/86  Pulse:  73  Resp:  16  Temp: 98.8 F (37.1 C) 97.9 F (36.6 C)  SpO2:  99%     General: Awake, interactive  CV:  Regular rate, good peripheral perfusion.  Resp:  Lungs clear, unlabored respirations.  Abd:  Soft, nondistended, focal tenderness in the right lower quadrant without rebound or guarding, remainder of abdomen without significant tenderness Neuro:  Symmetric facial movement, fluid speech   ED Results / Procedures / Treatments   Labs (all labs ordered are listed, but only abnormal results are displayed) Labs Reviewed  CBC WITH DIFFERENTIAL/PLATELET - Abnormal; Notable for the following components:      Result Value   WBC 11.2 (*)    All other components within normal limits  COMPREHENSIVE METABOLIC PANEL - Abnormal; Notable for the following components:   CO2 20 (*)    Glucose, Bld 161  (*)    All other components within normal limits  URINALYSIS, ROUTINE W REFLEX MICROSCOPIC - Abnormal; Notable for the following components:   Color, Urine YELLOW (*)    APPearance CLEAR (*)    Specific Gravity, Urine 1.043 (*)    Glucose, UA 50 (*)    Protein, ur 30 (*)    Bacteria, UA RARE (*)    All other components within normal limits  SURGICAL PCR SCREEN  LIPASE, BLOOD     EKG EKG independently reviewed interpreted by myself (ER attending) demonstrates:    RADIOLOGY Imaging independently reviewed and interpreted by myself demonstrates:  CT demonstrates acute appendicitis  PROCEDURES:  Critical Care performed: No  Procedures   MEDICATIONS ORDERED IN ED: Medications  abacavir-dolutegravir-lamiVUDine (TRIUMEQ) 600-50-300 MG per tablet 1 tablet ( Oral Automatically Held 07/21/22 1000)  traMADol (ULTRAM) tablet 50 mg ( Oral MAR Hold 07/12/22 2235)  HYDROcodone-acetaminophen (NORCO/VICODIN) 5-325 MG per tablet 1-2 tablet ( Oral MAR Hold 07/12/22 2235)  morphine (PF) 2 MG/ML injection 2 mg ( Intravenous MAR Hold 07/12/22 2235)  docusate sodium (COLACE) capsule 100 mg (  Oral MAR Hold 07/12/22 2235)  ondansetron (ZOFRAN-ODT) disintegrating tablet 4 mg ( Oral MAR Hold 07/12/22 2235)    Or  ondansetron (ZOFRAN) injection 4 mg ( Intravenous MAR Hold 07/12/22 2235)  enoxaparin (LOVENOX) injection 40 mg ( Subcutaneous Automatically Held 07/21/22 2200)  0.9 %  sodium chloride infusion ( Intravenous Continued from Pre-op 07/12/22 2230)  cefTRIAXone (ROCEPHIN) 2 g in sodium chloride 0.9 % 100 mL IVPB ( Intravenous Automatically Held 07/18/22 2015)    And  metroNIDAZOLE (FLAGYL) IVPB 500 mg ( Intravenous Automatically Held 07/19/22 0815)  mupirocin ointment (BACTROBAN) 2 % 1 Application (has no administration in time range)  bupivacaine (MARCAINE) 30 mL, lidocaine-EPINEPHrine (PF) (XYLOCAINE-EPINEPHrine) 1 %-1:200000 30 mL (15 mLs Injection Given 07/12/22 2305)  ketorolac (TORADOL) 30 MG/ML  injection 15 mg (15 mg Intravenous Given 07/12/22 1549)  iohexol (OMNIPAQUE) 300 MG/ML solution 100 mL (100 mLs Intravenous Contrast Given 07/12/22 1630)     IMPRESSION / MDM / ASSESSMENT AND PLAN / ED COURSE  I reviewed the triage vital signs and the nursing notes.  Differential diagnosis includes, but is not limited to, appendicitis, mesenteric adenitis, bowel obstruction, right-sided diverticulitis, other acute intra-abdominal process  Patient's presentation is most consistent with acute presentation with potential threat to life or bodily function.  This 62 year old male presenting to the emergency department for evaluation of abdominal pain with focal right lower quadrant tenderness on exam.  Labs sent from triage.  CT abdomen pelvis ordered to further evaluate.  Patient declining narcotic pain medicine.  Will treat with Toradol.  Currently declining any nausea medication.  Lab work with mild leukocytosis with WBC of 11.2, CMP without severe derangement.  Normal lipase.  CT does demonstrate findings of acute appendicitis without associated abscess or perforation.  Patient did on the results of his workup.  Case discussed with Dr. Tonna Boehringer surgery.  He will come evaluate the patient.      FINAL CLINICAL IMPRESSION(S) / ED DIAGNOSES   Final diagnoses:  Acute appendicitis, unspecified acute appendicitis type     Rx / DC Orders   ED Discharge Orders     None        Note:  This document was prepared using Dragon voice recognition software and may include unintentional dictation errors.   Trinna Post, MD 07/12/22 2312

## 2022-07-12 NOTE — Transfer of Care (Signed)
Immediate Anesthesia Transfer of Care Note  Patient: Jason Townsend  Procedure(s) Performed: XI ROBOTIC LAPAROSCOPIC ASSISTED APPENDECTOMY (Abdomen)  Patient Location: PACU  Anesthesia Type:General  Level of Consciousness: awake, drowsy, and patient cooperative  Airway & Oxygen Therapy: Patient Spontanous Breathing and Patient connected to face mask oxygen  Post-op Assessment: Report given to RN and Post -op Vital signs reviewed and stable  Post vital signs: Reviewed and stable  Last Vitals:  Vitals Value Taken Time  BP 137/79 07/12/22 2347  Temp 36.7 C 07/12/22 2347  Pulse 77 07/12/22 2352  Resp 16 07/12/22 2352  SpO2 97 % 07/12/22 2352  Vitals shown include unvalidated device data.  Last Pain:  Vitals:   07/12/22 2347  TempSrc:   PainSc: Asleep         Complications: No notable events documented.

## 2022-07-13 ENCOUNTER — Encounter: Payer: Self-pay | Admitting: Surgery

## 2022-07-13 LAB — SURGICAL PCR SCREEN
MRSA, PCR: NEGATIVE
Staphylococcus aureus: NEGATIVE

## 2022-07-13 LAB — BASIC METABOLIC PANEL
Anion gap: 10 (ref 5–15)
BUN: 14 mg/dL (ref 6–20)
CO2: 21 mmol/L — ABNORMAL LOW (ref 22–32)
Calcium: 8.5 mg/dL — ABNORMAL LOW (ref 8.9–10.3)
Chloride: 105 mmol/L (ref 98–111)
Creatinine, Ser: 0.84 mg/dL (ref 0.61–1.24)
GFR, Estimated: >60 mL/min (ref >60)
Glucose, Bld: 152 mg/dL — ABNORMAL HIGH (ref 70–99)
Potassium: 4.6 mmol/L (ref 3.5–5.1)
Sodium: 136 mmol/L (ref 135–145)

## 2022-07-13 LAB — CBC
HCT: 42.3 % (ref 39.0–52.0)
Hemoglobin: 14.4 g/dL (ref 13.0–17.0)
MCH: 32.2 pg (ref 26.0–34.0)
MCHC: 34 g/dL (ref 30.0–36.0)
MCV: 94.6 fL (ref 80.0–100.0)
Platelets: 253 10*3/uL (ref 150–400)
RBC: 4.47 MIL/uL (ref 4.22–5.81)
RDW: 13.5 % (ref 11.5–15.5)
WBC: 7.5 10*3/uL (ref 4.0–10.5)
nRBC: 0 % (ref 0.0–0.2)

## 2022-07-13 MED ORDER — IBUPROFEN 800 MG PO TABS: 800.0000 mg | ORAL_TABLET | Freq: Three times a day (TID) | ORAL | 0 refills | Status: DC | PRN

## 2022-07-13 MED ORDER — OXYCODONE-ACETAMINOPHEN 5-325 MG PO TABS: 1.0000 | ORAL_TABLET | Freq: Three times a day (TID) | ORAL | 0 refills | Status: AC | PRN

## 2022-07-13 MED ORDER — DOCUSATE SODIUM 100 MG PO CAPS: 100.0000 mg | ORAL_CAPSULE | Freq: Two times a day (BID) | ORAL | 0 refills | Status: AC | PRN

## 2022-07-13 MED ORDER — ACETAMINOPHEN 325 MG PO TABS: 650.0000 mg | ORAL_TABLET | Freq: Three times a day (TID) | ORAL | 0 refills | Status: AC | PRN

## 2022-07-13 NOTE — Plan of Care (Signed)

## 2022-07-13 NOTE — Progress Notes (Signed)
Pt returned from PACU, s/p lap appendectomy. Denies pain. Alert and verbally responsive. VSS. No distress noted. Foley d/c'ed approximately 2330, pt due to void. Call bell and belongings left within reach. Bed in lowest position, alarm activated. Pt instructed to call when needing assist.

## 2022-07-13 NOTE — Discharge Instructions (Signed)
Laparoscopic Appendectomy, Care After This sheet gives you information about how to care for yourself after your procedure. Your doctor may also give you more specific instructions. If you have problems or questions, contact your doctor. Follow these instructions at home: Care for cuts from surgery (incisions)  Follow instructions from your doctor about how to take care of your cuts from surgery. Make sure you: Wash your hands with soap and water before you change your bandage (dressing). If you cannot use soap and water, use hand sanitizer. Change your bandage as told by your doctor. Leave stitches (sutures), skin glue, or skin tape (adhesive) strips in place. They may need to stay in place for 2 weeks or longer. If tape strips get loose and curl up, you may trim the loose edges. Do not remove tape strips completely unless your doctor says it is okay. Do not take baths, swim, or use a hot tub until your doctor says it is okay. OK TO SHOWER 24HRS AFTER YOUR SURGERY.  Check your surgical cut area every day for signs of infection. Check for: More redness, swelling, or pain. More fluid or blood. Warmth. Pus or a bad smell. Activity Do not drive or use heavy machinery while taking prescription pain medicine. Do not play contact sports until your doctor says it is okay. Do not drive for 24 hours if you were given a medicine to help you relax (sedative). Rest as needed. Do not return to work or school until your doctor says it is okay. General instructions  tylenol and advil as needed for discomfort.  Please alternate between the two every four hours as needed for pain.    Use narcotics, if prescribed, only when tylenol and motrin is not enough to control pain.  325-650mg every 8hrs to max of 3000mg/24hrs (including the 325mg in every norco dose) for the tylenol.    Advil up to 800mg per dose every 8hrs as needed for pain.   To prevent or treat constipation while you are taking prescription pain  medicine, your doctor may recommend that you: Drink enough fluid to keep your pee (urine) clear or pale yellow. Take over-the-counter or prescription medicines. Eat foods that are high in fiber, such as fresh fruits and vegetables, whole grains, and beans. Limit foods that are high in fat and processed sugars, such as fried and sweet foods. Contact a doctor if: You develop a rash. You have more redness, swelling, or pain around your surgical cuts. You have more fluid or blood coming from your surgical cuts. Your surgical cuts feel warm to the touch. You have pus or a bad smell coming from your surgical cuts. You have a fever. One or more of your surgical cuts breaks open. Get help right away if: You have trouble breathing. You have chest pain. You faint or feel dizzy when you stand. You have leg pain. This information is not intended to replace advice given to you by your health care provider. Make sure you discuss any questions you have with your health care provider. Document Released: 10/20/2007 Document Revised: 08/01/2015 Document Reviewed: 06/29/2015 Elsevier Interactive Patient Education  2019 Elsevier Inc.  

## 2022-07-14 LAB — SURGICAL PATHOLOGY

## 2022-07-14 NOTE — Anesthesia Postprocedure Evaluation (Signed)
Anesthesia Post Note  Patient: Jason Townsend  Procedure(s) Performed: XI ROBOTIC LAPAROSCOPIC ASSISTED APPENDECTOMY (Abdomen)  Patient location during evaluation: PACU Anesthesia Type: General Level of consciousness: awake and alert Pain management: pain level controlled Vital Signs Assessment: post-procedure vital signs reviewed and stable Respiratory status: spontaneous breathing, nonlabored ventilation, respiratory function stable and patient connected to nasal cannula oxygen Cardiovascular status: blood pressure returned to baseline and stable Postop Assessment: no apparent nausea or vomiting Anesthetic complications: no   No notable events documented.   Last Vitals:  Vitals:   07/13/22 0737 07/13/22 1609  BP: 122/84 128/81  Pulse: 71 81  Resp: 18 18  Temp: 36.6 C 37.1 C  SpO2: 94% 94%    Last Pain:  Vitals:   07/13/22 1018  TempSrc:   PainSc: 0-No pain                 Stephanie Coup

## 2022-07-14 NOTE — Discharge Summary (Signed)
Physician Discharge Summary  Patient ID: Jason Townsend MRN: [id number] DOB/AGE: 25-May-1960 62 y.o.  Admit date: 07/12/2022 Discharge date: 07/13/22  Admission Diagnoses: acute appendicitis  Discharge Diagnoses:  Same as above  Discharged Condition: good  Hospital Course: admitted for above. Underwent surgery.  Please see op note for details.  Post op, recovered as expected.  At time of d/c, tolerating diet and pain controlled  Consults: None  Discharge Exam: Blood pressure 128/81, pulse 81, temperature 98.8 F (37.1 C), resp. rate 18, height 6\' 2"  (1.88 m), weight 94.3 kg, SpO2 94 %. General appearance: alert, cooperative, and no distress GI: soft, no guarding, minor TTP RLQ as expected.  Incisions c/d/i  Disposition:  Discharge disposition: 01-Home or Self Care        Allergies as of 07/13/2022   No Known Allergies      Medication List     TAKE these medications    acetaminophen 325 MG tablet Commonly known as: Tylenol Take 2 tablets (650 mg total) by mouth every 8 (eight) hours as needed for mild pain.   atorvastatin 10 MG tablet Commonly known as: LIPITOR TAKE 1 TABLET BY MOUTH DAILY   docusate sodium 100 MG capsule Commonly known as: Colace Take 1 capsule (100 mg total) by mouth 2 (two) times daily as needed for up to 10 days for mild constipation.   ibuprofen 800 MG tablet Commonly known as: ADVIL Take 1 tablet (800 mg total) by mouth every 8 (eight) hours as needed for mild pain or moderate pain.   loratadine 10 MG tablet Commonly known as: CLARITIN   oxyCODONE-acetaminophen 5-325 MG tablet Commonly known as: Percocet Take 1 tablet by mouth every 8 (eight) hours as needed for severe pain.   Thera-M Tabs Take 1 tablet by mouth daily.   Triumeq 600-50-300 MG tablet Generic drug: abacavir-dolutegravir-lamiVUDine TAKE 1 TABLET BY MOUTH DAILY   Vitamin D3 125 MCG (5000 UT) Caps   Zinc 50 MG Tabs        Follow-up Information      Agnieszka Newhouse, DO Follow up in 2 week(s).   Specialties: General Surgery, Surgery Why: post op appy Contact information: [address] [place] [address] [phone]                  Total time spent arranging discharge was >77min. Signed: Sung Amabile 07/14/2022, 5:23 AM

## 2022-07-25 ENCOUNTER — Other Ambulatory Visit: Payer: Self-pay

## 2022-07-25 DIAGNOSIS — B20 Human immunodeficiency virus [HIV] disease: Secondary | ICD-10-CM

## 2022-08-02 ENCOUNTER — Encounter: Payer: Self-pay | Admitting: Surgery

## 2022-08-15 ENCOUNTER — Other Ambulatory Visit: Payer: Self-pay

## 2022-08-15 DIAGNOSIS — B20 Human immunodeficiency virus [HIV] disease: Secondary | ICD-10-CM

## 2022-08-15 MED ORDER — TRIUMEQ 600-50-300 MG PO TABS: 1.0000 | ORAL_TABLET | Freq: Every day | ORAL | 0 refills | Status: DC

## 2022-08-29 ENCOUNTER — Other Ambulatory Visit: Payer: Self-pay | Admitting: Infectious Diseases

## 2022-08-29 DIAGNOSIS — B20 Human immunodeficiency virus [HIV] disease: Secondary | ICD-10-CM

## 2022-08-30 MED ORDER — TRIUMEQ 600-50-300 MG PO TABS
1.0000 | ORAL_TABLET | Freq: Every day | ORAL | 0 refills | Status: DC
Start: 2022-08-30 — End: 2022-11-09

## 2022-09-30 ENCOUNTER — Other Ambulatory Visit: Payer: Self-pay | Admitting: Physician Assistant

## 2022-09-30 ENCOUNTER — Ambulatory Visit
Admission: RE | Admit: 2022-09-30 | Discharge: 2022-09-30 | Disposition: A | Discharge status: Home / Self Care | Payer: 59 | Source: Ambulatory Visit | Attending: Physician Assistant | Admitting: Physician Assistant

## 2022-09-30 DIAGNOSIS — M25561 Pain in right knee: Secondary | ICD-10-CM

## 2022-10-11 ENCOUNTER — Encounter: Payer: Self-pay | Admitting: Infectious Diseases

## 2022-11-09 ENCOUNTER — Other Ambulatory Visit: Payer: Self-pay | Admitting: Infectious Diseases

## 2022-11-09 DIAGNOSIS — B20 Human immunodeficiency virus [HIV] disease: Secondary | ICD-10-CM

## 2022-11-10 ENCOUNTER — Encounter: Payer: Self-pay | Admitting: Infectious Diseases

## 2022-11-10 DIAGNOSIS — B20 Human immunodeficiency virus [HIV] disease: Secondary | ICD-10-CM

## 2022-11-11 MED ORDER — TRIUMEQ 600-50-300 MG PO TABS: 1.0000 | ORAL_TABLET | Freq: Every day | ORAL | 3 refills | Status: DC

## 2022-12-14 ENCOUNTER — Other Ambulatory Visit: Payer: Self-pay | Admitting: Internal Medicine

## 2022-12-14 DIAGNOSIS — E782 Mixed hyperlipidemia: Secondary | ICD-10-CM

## 2023-02-22 ENCOUNTER — Other Ambulatory Visit (HOSPITAL_COMMUNITY)
Admission: RE | Admit: 2023-02-22 | Discharge: 2023-02-22 | Disposition: A | Discharge status: Home / Self Care | Payer: 59 | Source: Ambulatory Visit | Attending: Infectious Diseases | Admitting: Infectious Diseases

## 2023-02-22 ENCOUNTER — Encounter: Payer: Self-pay | Admitting: Infectious Diseases

## 2023-02-22 ENCOUNTER — Ambulatory Visit (INDEPENDENT_AMBULATORY_CARE_PROVIDER_SITE_OTHER): Payer: 59 | Admitting: Infectious Diseases

## 2023-02-22 ENCOUNTER — Other Ambulatory Visit: Payer: Self-pay

## 2023-02-22 VITALS — BP 151/83 | HR 78 | Temp 98.7°F | Ht 73.0 in | Wt 212.0 lb

## 2023-02-22 DIAGNOSIS — Z720 Tobacco use: Secondary | ICD-10-CM

## 2023-02-22 DIAGNOSIS — Z79899 Other long term (current) drug therapy: Secondary | ICD-10-CM

## 2023-02-22 DIAGNOSIS — Z113 Encounter for screening for infections with a predominantly sexual mode of transmission: Secondary | ICD-10-CM

## 2023-02-22 DIAGNOSIS — E785 Hyperlipidemia, unspecified: Secondary | ICD-10-CM | POA: Diagnosis not present

## 2023-02-22 DIAGNOSIS — Z96642 Presence of left artificial hip joint: Secondary | ICD-10-CM

## 2023-02-22 DIAGNOSIS — F32A Depression, unspecified: Secondary | ICD-10-CM | POA: Diagnosis not present

## 2023-02-22 DIAGNOSIS — K353 Acute appendicitis with localized peritonitis, without perforation or gangrene: Secondary | ICD-10-CM

## 2023-02-22 DIAGNOSIS — B20 Human immunodeficiency virus [HIV] disease: Secondary | ICD-10-CM | POA: Diagnosis not present

## 2023-02-22 DIAGNOSIS — I10 Essential (primary) hypertension: Secondary | ICD-10-CM

## 2023-02-22 DIAGNOSIS — F17210 Nicotine dependence, cigarettes, uncomplicated: Secondary | ICD-10-CM

## 2023-02-22 MED ORDER — IBUPROFEN 800 MG PO TABS
800.0000 mg | ORAL_TABLET | Freq: Three times a day (TID) | ORAL | 0 refills | Status: DC | PRN
Start: 2023-02-22 — End: 2023-03-22

## 2023-02-22 MED ORDER — SERTRALINE HCL 50 MG PO TABS
50.0000 mg | ORAL_TABLET | Freq: Every day | ORAL | 3 refills | Status: AC
Start: 2023-02-22 — End: ?

## 2023-02-22 NOTE — Assessment & Plan Note (Signed)
Continues on statin Recheck his lipids and LFTs today.

## 2023-02-22 NOTE — Assessment & Plan Note (Signed)
Asx Will f/u with his PCP

## 2023-02-22 NOTE — Progress Notes (Signed)
   Subjective:    Patient ID: Jason Townsend, male  DOB: 06-28-1960, 63 y.o.        MRN: [id number]   HPI 63 yo M with hx of HIV+ (dx 08-2010), currently on triumeq (06-2013), initially on truvada/darunavir-norvir/intelence, HLA-, prev K103N), previously followed at [facility]  He also has a hx of depression.  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  They feel well, and would not notice except for scar.  Got married 03-2017, positive, in care at [facility]).    Lost job in June, has had difficulty finding new work.  Going to be a "great-uncle" soon.   Had lung CA screening CT 04-2022: birads1.    Still smokes 2/3 ppd. Has tried buproprion and did not like.  Has a bourbon every night.  Had appendix out 06-2022.   Is on zoloft 50mg  qday. Also in therapy.   Covid and flu and pcv uptodate.   HIV 1 RNA Quant (Copies/mL)  Date Value  09/24/2021 <20 (H)  08/31/2021 1,440 (H)  11/27/2020 251 (H)   HIV-1 RNA Viral Load (copies/mL)  Date Value  05/18/2022 90   CD4 T Cell Abs (/uL)  Date Value  05/18/2022 1,136  08/31/2021 745  02/03/2017 880     Health Maintenance  Topic Date Due  . Zoster Vaccines- Shingrix (1 of 2) 11/24/1981  . COVID-19 Vaccine (7 - 2024-25 season) 12/06/2022  . DTaP/Tdap/Td (2 - Td or Tdap) 09/28/2027  . Colonoscopy  12/02/2027  . Pneumococcal Vaccine 8-75 Years old  Completed  . INFLUENZA VACCINE  Completed  . Hepatitis C Screening  Completed  . HIV Screening  Completed  . HPV VACCINES  Aged Out      Review of Systems  Constitutional:  Negative for chills, fever and weight loss.  Respiratory:  Negative for cough and shortness of breath.   Cardiovascular:  Negative for chest pain.  Gastrointestinal:  Negative for constipation and diarrhea.  Genitourinary:  Negative for dysuria.  Neurological:  Negative for headaches.   Please see HPI. All other systems reviewed and negative.      Objective:  Physical Exam Vitals reviewed.  Constitutional:      Appearance: Normal appearance.  HENT:     Mouth/Throat:     Mouth: Mucous membranes are moist.     Pharynx: No oropharyngeal exudate.  Eyes:     Extraocular Movements: Extraocular movements intact.     Pupils: Pupils are equal, round, and reactive to light.  Cardiovascular:     Rate and Rhythm: Normal rate and regular rhythm.  Pulmonary:     Effort: Pulmonary effort is normal.     Breath sounds: Normal breath sounds.  Abdominal:     General: Bowel sounds are normal. There is no distension.     Palpations: Abdomen is soft.     Tenderness: There is no abdominal tenderness.  Musculoskeletal:        General: Normal range of motion.     Cervical back: Normal range of motion and neck supple.     Right lower leg: No edema.     Left lower leg: No edema.  Neurological:     General: No focal deficit present.     Mental Status: He is alert.  Psychiatric:        Mood and Affect: Mood normal.          Assessment & Plan:

## 2023-02-22 NOTE — Assessment & Plan Note (Signed)
Refilled his zoloft.  Continue counseling.

## 2023-02-22 NOTE — Assessment & Plan Note (Signed)
Resolved Appreciate Dr Geoffery Lyons f/u.

## 2023-02-22 NOTE — Assessment & Plan Note (Signed)
He is doing well We discussed cabaneuva as well as dovato. He would like to stay on his current rx Discussed mixed data about CV risk.  Vax are up to date.  Colon as well.  Will see him back in 9-12 months.  He will review his labs on mychart.

## 2023-02-22 NOTE — Assessment & Plan Note (Signed)
Encouraged to quit.

## 2023-02-22 NOTE — Assessment & Plan Note (Signed)
Refilled his ibuprofen 800mg  prn Asked to take tylenol alternating as well.

## 2023-02-23 LAB — T-HELPER CELLS (CD4) COUNT (NOT AT ARMC)
CD4 % Helper T Cell: 43 % (ref 33–65)
CD4 T Cell Abs: 1096 /uL (ref 400–1790)

## 2023-02-23 LAB — URINE CYTOLOGY ANCILLARY ONLY
Chlamydia: NEGATIVE
Comment: NEGATIVE
Comment: NORMAL
Neisseria Gonorrhea: NEGATIVE

## 2023-02-24 LAB — COMPREHENSIVE METABOLIC PANEL
ALT: 22 [IU]/L (ref 0–44)
AST: 18 [IU]/L (ref 0–40)
Albumin: 4.8 g/dL (ref 3.8–4.9)
Alkaline Phosphatase: 121 [IU]/L (ref 44–121)
BUN/Creatinine Ratio: 14 (ref 10–24)
BUN: 13 mg/dL (ref 8–27)
Bilirubin Total: 0.3 mg/dL (ref 0.0–1.2)
CO2: 18 mmol/L — ABNORMAL LOW (ref 20–29)
Calcium: 9.8 mg/dL (ref 8.6–10.2)
Chloride: 101 mmol/L (ref 96–106)
Creatinine, Ser: 0.91 mg/dL (ref 0.76–1.27)
Globulin, Total: 2.8 g/dL (ref 1.5–4.5)
Glucose: 90 mg/dL (ref 70–99)
Potassium: 4.4 mmol/L (ref 3.5–5.2)
Sodium: 139 mmol/L (ref 134–144)
Total Protein: 7.6 g/dL (ref 6.0–8.5)
eGFR: 96 mL/min/{1.73_m2} (ref >59)

## 2023-02-24 LAB — RPR, QUANT+TP ABS (REFLEX)
Rapid Plasma Reagin, Quant: 1:2 {titer} — ABNORMAL HIGH
T Pallidum Abs: REACTIVE — AB

## 2023-02-24 LAB — CBC WITH DIFFERENTIAL/PLATELET
Basophils Absolute: 0.1 10*3/uL (ref 0.0–0.2)
Basos: 1 %
EOS (ABSOLUTE): 0.2 10*3/uL (ref 0.0–0.4)
Eos: 3 %
Hematocrit: 42.7 % (ref 37.5–51.0)
Hemoglobin: 14.8 g/dL (ref 13.0–17.7)
Immature Grans (Abs): 0.1 10*3/uL (ref 0.0–0.1)
Immature Granulocytes: 1 %
Lymphocytes Absolute: 2.7 10*3/uL (ref 0.7–3.1)
Lymphs: 33 %
MCH: 32.5 pg (ref 26.6–33.0)
MCHC: 34.7 g/dL (ref 31.5–35.7)
MCV: 94 fL (ref 79–97)
Monocytes Absolute: 0.8 10*3/uL (ref 0.1–0.9)
Monocytes: 10 %
Neutrophils Absolute: 4.2 10*3/uL (ref 1.4–7.0)
Neutrophils: 52 %
Platelets: 283 10*3/uL (ref 150–450)
RBC: 4.55 x10E6/uL (ref 4.14–5.80)
RDW: 13.1 % (ref 11.6–15.4)
WBC: 7.9 10*3/uL (ref 3.4–10.8)

## 2023-02-24 LAB — HIV-1 RNA QUANT-NO REFLEX-BLD
HIV-1 RNA Viral Load Log: 2.079 {Log}
HIV-1 RNA Viral Load: 120 {copies}/mL

## 2023-02-24 LAB — RPR: RPR Ser Ql: REACTIVE — AB

## 2023-02-24 LAB — LIPID PANEL
Chol/HDL Ratio: 3.3 {ratio} (ref 0.0–5.0)
Cholesterol, Total: 200 mg/dL — ABNORMAL HIGH (ref 100–199)
HDL: 60 mg/dL (ref >39)
LDL Chol Calc (NIH): 78 mg/dL (ref 0–99)
Triglycerides: 390 mg/dL — ABNORMAL HIGH (ref 0–149)
VLDL Cholesterol Cal: 62 mg/dL — ABNORMAL HIGH (ref 5–40)

## 2023-03-22 ENCOUNTER — Other Ambulatory Visit: Payer: Self-pay | Admitting: Infectious Diseases

## 2023-03-22 DIAGNOSIS — B20 Human immunodeficiency virus [HIV] disease: Secondary | ICD-10-CM

## 2023-03-22 MED ORDER — IBUPROFEN 800 MG PO TABS: 800.0000 mg | ORAL_TABLET | Freq: Three times a day (TID) | ORAL | 0 refills | Status: AC | PRN

## 2023-05-08 ENCOUNTER — Other Ambulatory Visit: Payer: Self-pay | Admitting: Physician Assistant

## 2023-05-08 ENCOUNTER — Other Ambulatory Visit (HOSPITAL_COMMUNITY): Payer: Self-pay | Admitting: Physician Assistant

## 2023-05-08 DIAGNOSIS — I77819 Aortic ectasia, unspecified site: Secondary | ICD-10-CM

## 2023-05-08 DIAGNOSIS — F17200 Nicotine dependence, unspecified, uncomplicated: Secondary | ICD-10-CM

## 2023-05-16 ENCOUNTER — Ambulatory Visit (HOSPITAL_COMMUNITY): Attending: Cardiovascular Disease

## 2023-05-16 DIAGNOSIS — I10 Essential (primary) hypertension: Secondary | ICD-10-CM

## 2023-05-16 DIAGNOSIS — I77819 Aortic ectasia, unspecified site: Secondary | ICD-10-CM | POA: Insufficient documentation

## 2023-05-16 LAB — ECHOCARDIOGRAM COMPLETE
AR max vel: 3.89 cm2
AV Area VTI: 3.78 cm2
AV Area mean vel: 3.78 cm2
AV Mean grad: 3 mmHg
AV Peak grad: 5.5 mmHg
Ao pk vel: 1.18 m/s
Calc EF: 59.3 %
S' Lateral: 3.24 cm
Single Plane A2C EF: 65.7 %
Single Plane A4C EF: 57.8 %

## 2023-05-23 ENCOUNTER — Ambulatory Visit
Admission: RE | Admit: 2023-05-23 | Discharge: 2023-05-23 | Disposition: A | Discharge status: Home / Self Care | Source: Ambulatory Visit | Attending: Physician Assistant | Admitting: Physician Assistant

## 2023-05-23 DIAGNOSIS — F17200 Nicotine dependence, unspecified, uncomplicated: Secondary | ICD-10-CM

## 2023-06-03 IMAGING — CT CT CHEST LUNG CANCER SCREENING LOW DOSE W/O CM
1 series · 10 of 10 positions shown, 13 images · non-contrast
Comparison: 05/15/2020

CLINICAL DATA: 52-year-old male with 27 pack-year history of
smoking. Lung cancer screening.



[ct lung segmentation data · axial · 0.74mm/px · z∈[-360,-360]mm · 10 of 310 frames shown]
[frame 1/310  mediastinal]
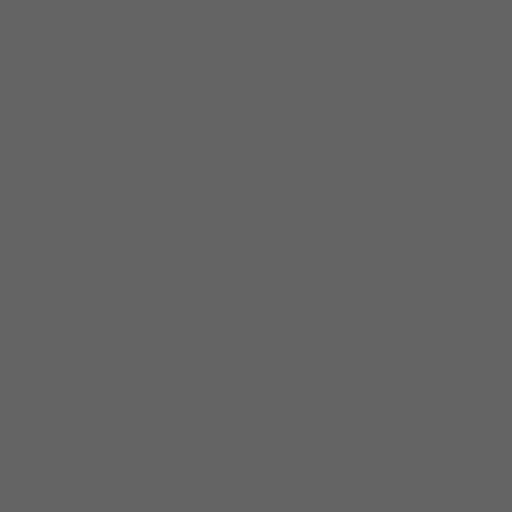
[frame 1/310  lung]
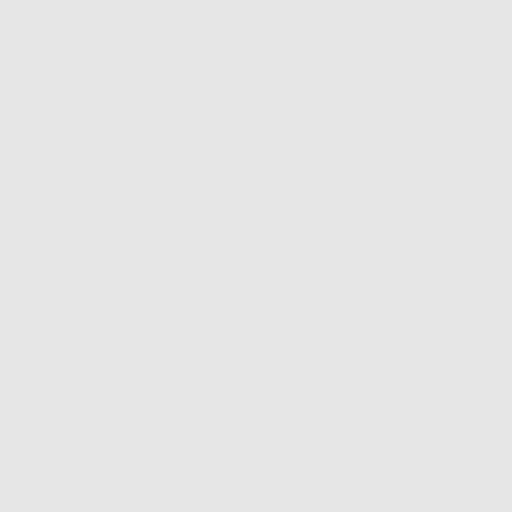
[frame 35/310  lung]
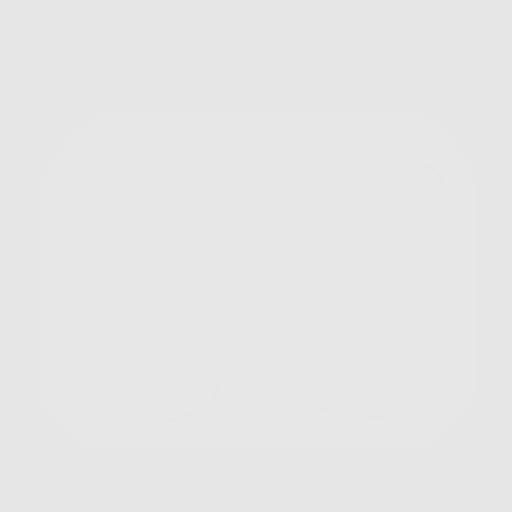
[frame 69/310  lung]
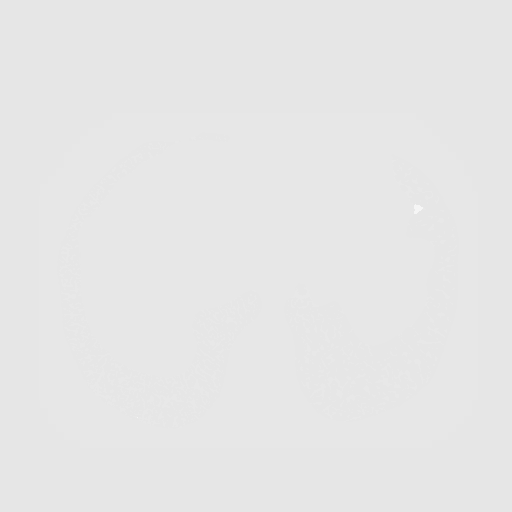
[frame 104/310  lung]
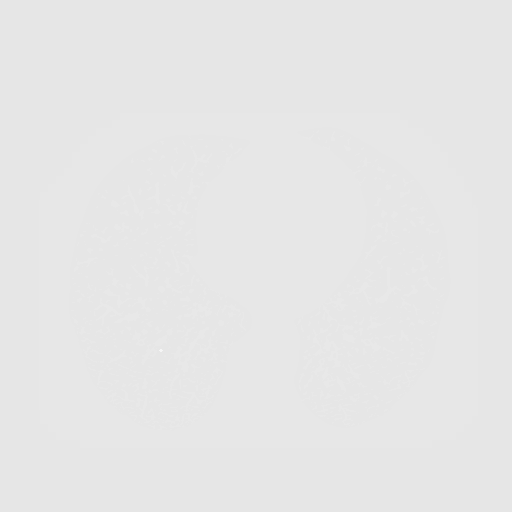
[frame 138/310  mediastinal]
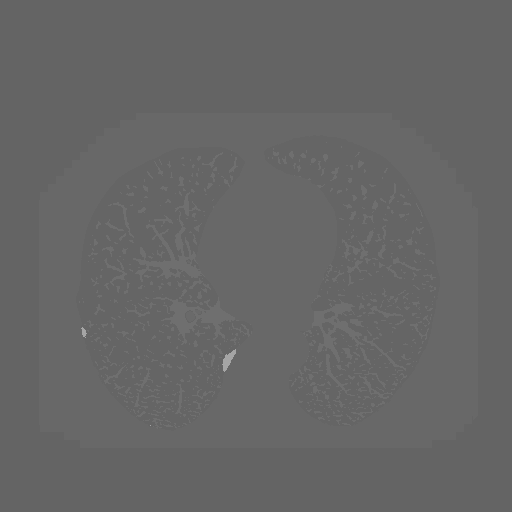
[frame 138/310  lung]
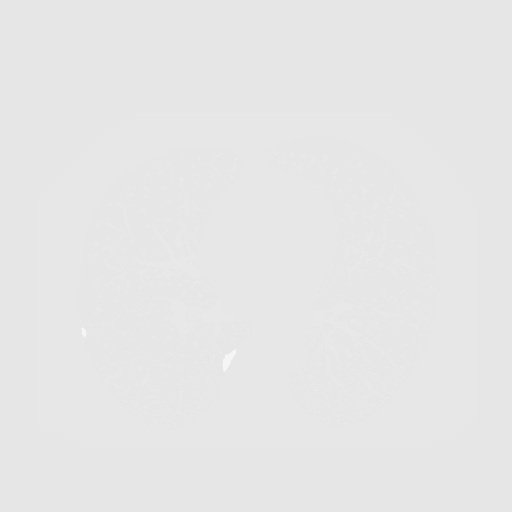
[frame 172/310  lung]
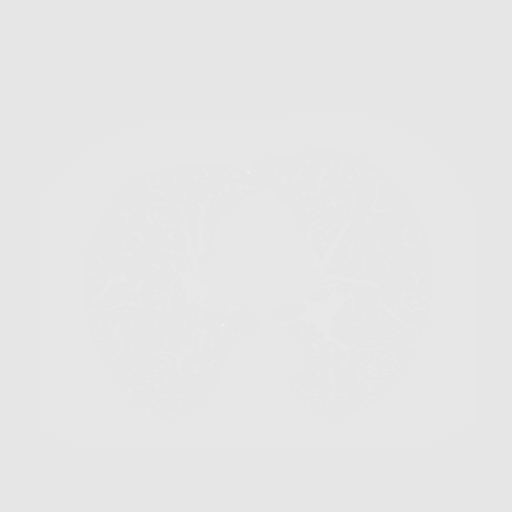
[frame 207/310  lung]
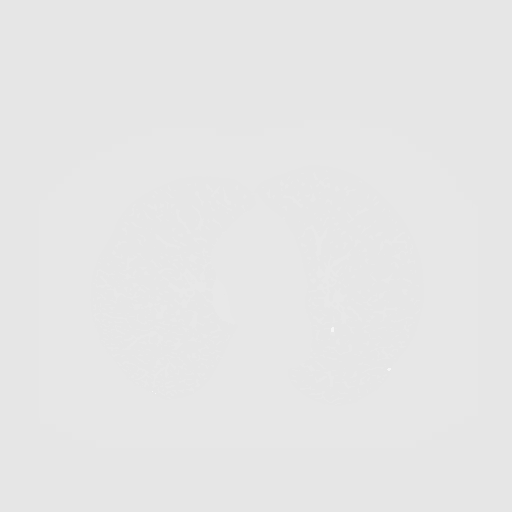
[frame 241/310  lung]
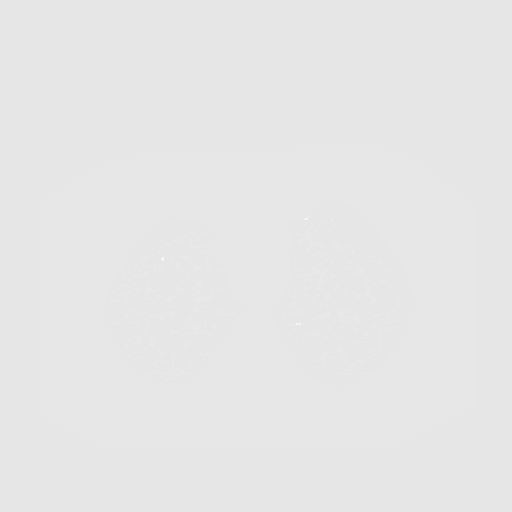
[frame 275/310  mediastinal]
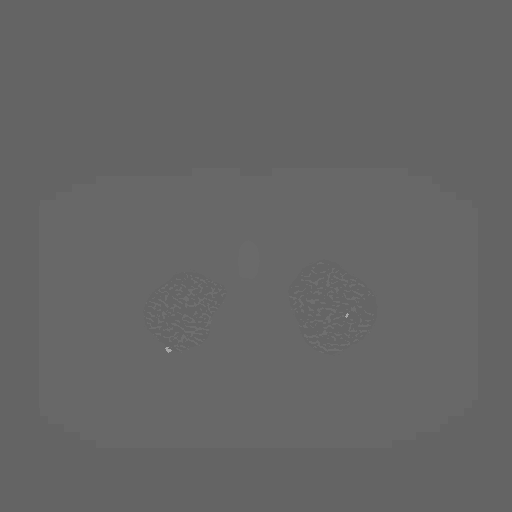
[frame 275/310  lung]
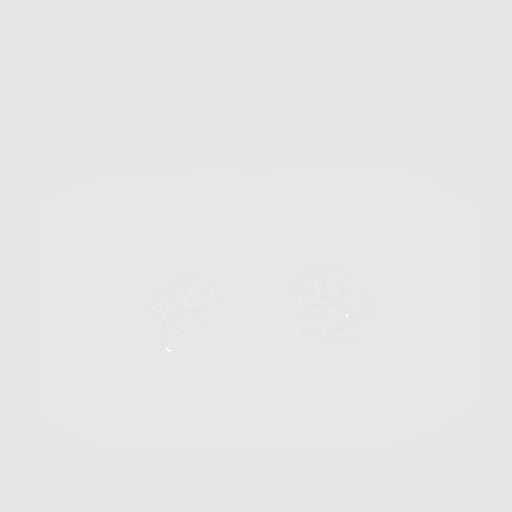
[frame 310/310  lung]
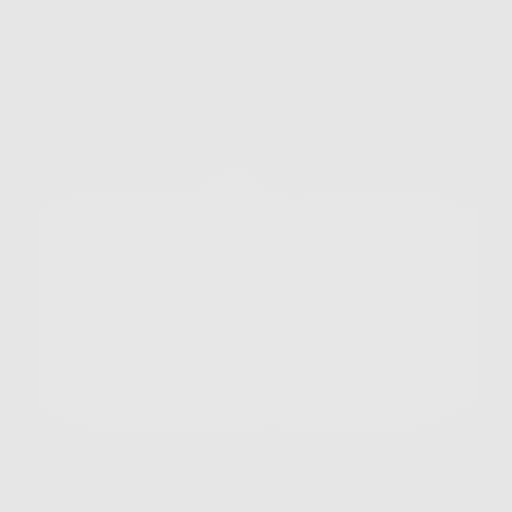

[10 of 10 positions shown; findings below may reference images not displayed]

FINDINGS: Cardiovascular: The heart size is normal. No substantial pericardial
effusion. No thoracic aortic aneurysm. No substantial
atherosclerosis of the thoracic aorta.

Mediastinum/Nodes: No mediastinal lymphadenopathy. No evidence for
gross hilar lymphadenopathy although assessment is limited by the
lack of intravenous contrast on the current study. The esophagus has
normal imaging features. There is no axillary lymphadenopathy.

Lungs/Pleura: Centrilobular emphsyema noted. Calcified granulomata
again noted in the right lung. There is no new suspicious pulmonary
nodule or mass. No focal airspace consolidation. No pleural
effusion.

Upper Abdomen: Small left adrenal adenoma stable. No followup
recommended.

Musculoskeletal: No worrisome lytic or sclerotic osseous
abnormality.
IMPRESSION: 1. Lung-RADS 1, negative. Continue annual screening with low-dose
chest CT without contrast in 12 months.
2. Stable small right adrenal adenoma.

Emphysema (19DVX-MOQ.G) and Aortic Atherosclerosis (19DVX-170.0)

## 2023-06-03 IMAGING — CT CT CARDIAC CORONARY ARTERY CALCIUM SCORE
2 of 3 series · 12 of 20 positions shown, 15 images · non-contrast
Comparison: Chest CT from 05/15/2020

CLINICAL DATA: 60-year-old Caucasian male with hyperlipidemia.

EXAM:
CT CARDIAC CORONARY ARTERY CALCIUM SCORE
TECHNIQUE: Non-contrast imaging through the heart was performed using
prospective ECG gating. Image post processing was performed on an
independent workstation, allowing for quantitative analysis of the
heart and coronary arteries. Note that this exam targets the heart
and the chest was not imaged in its entirety.

[Series 2: cascseq 2.0 d10f 70% · axial · 0.37mm/px · z∈[-109,-25]mm · 3 of 86 slices shown]
[im 22/86  vessel]
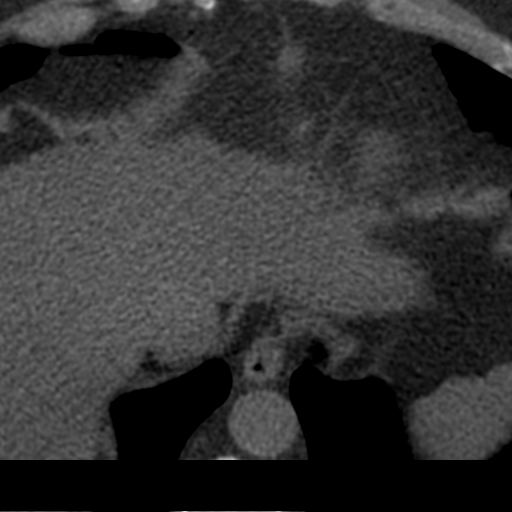
[im 43/86  vessel]
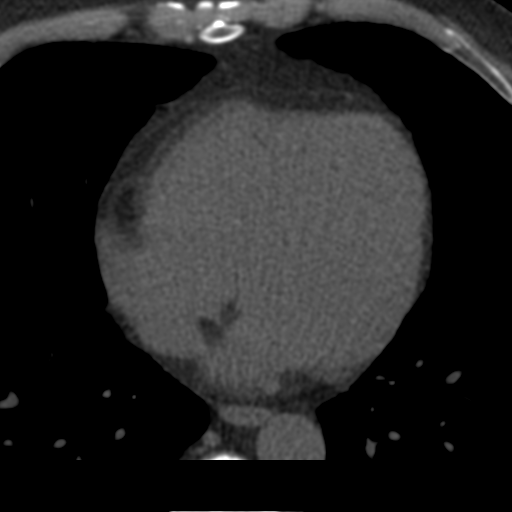
[im 64/86  vessel]
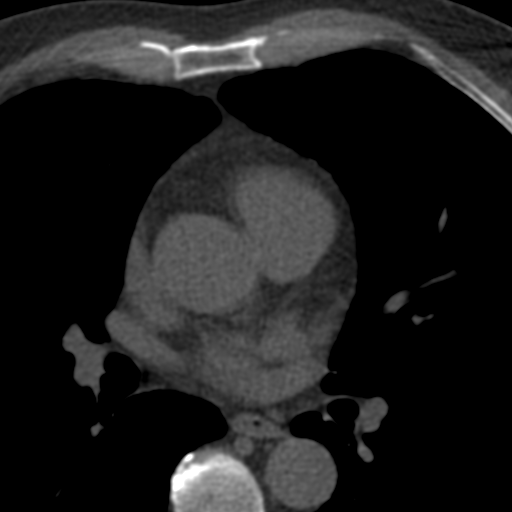

[Series 3: thin sfov · axial · 0.37mm/px · z∈[-135,+1]mm · 9 of 172 slices shown, 12 images]
[im 18/172  vessel]
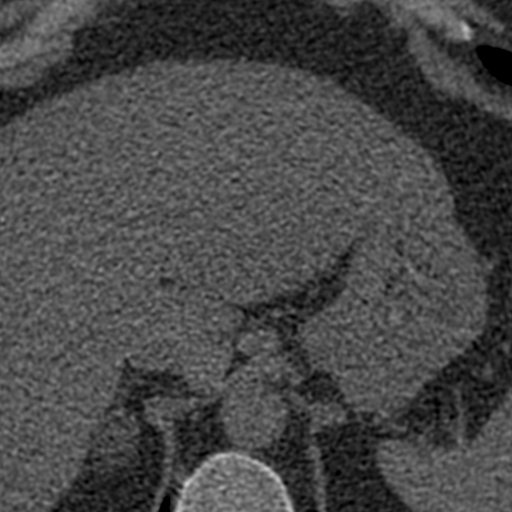
[im 18/172  lung]
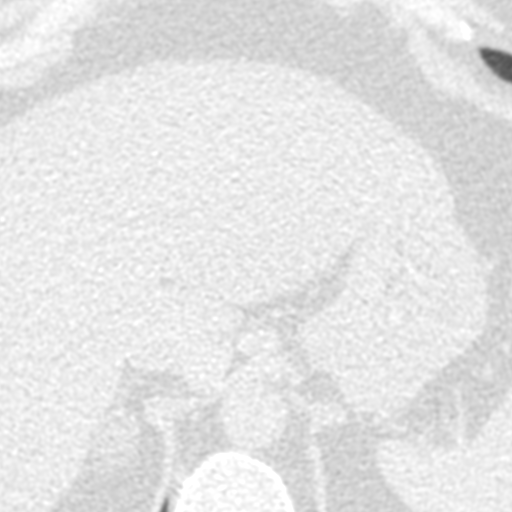
[im 35/172  vessel]
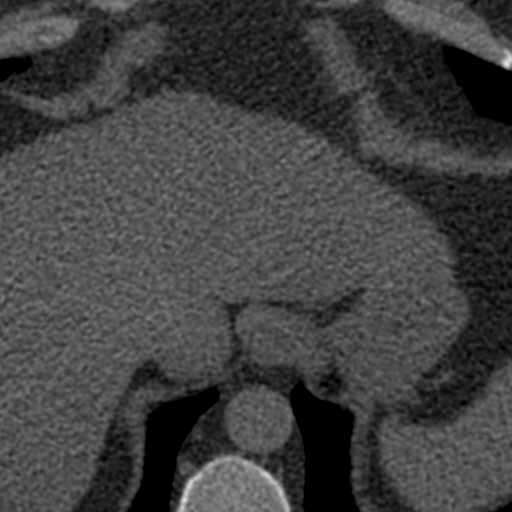
[im 52/172  vessel]
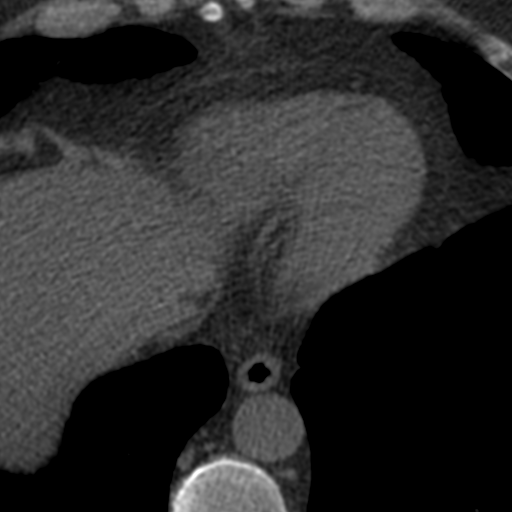
[im 69/172  vessel]
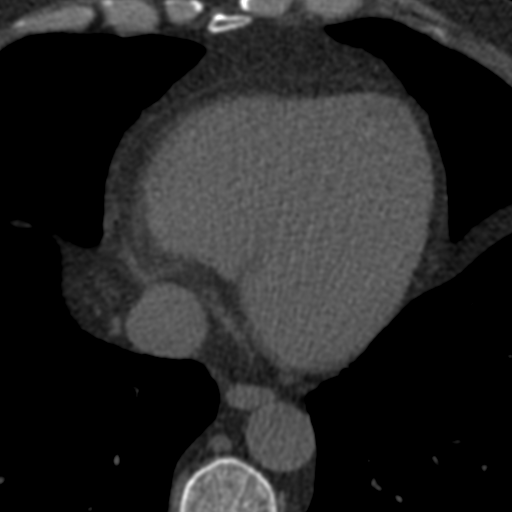
[im 86/172  vessel]
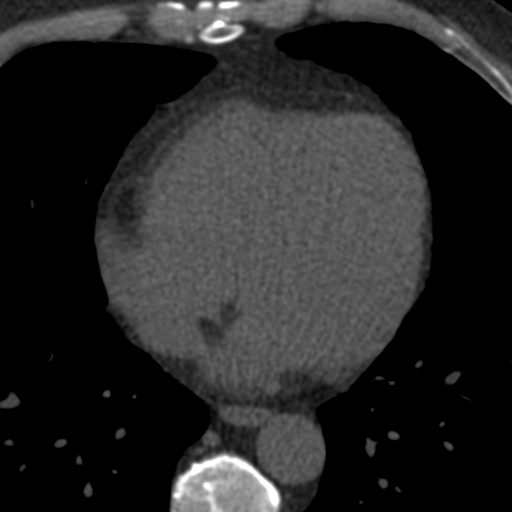
[im 86/172  lung]
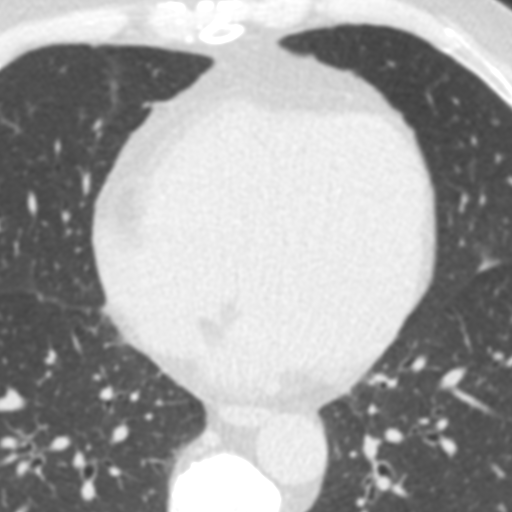
[im 103/172  vessel]
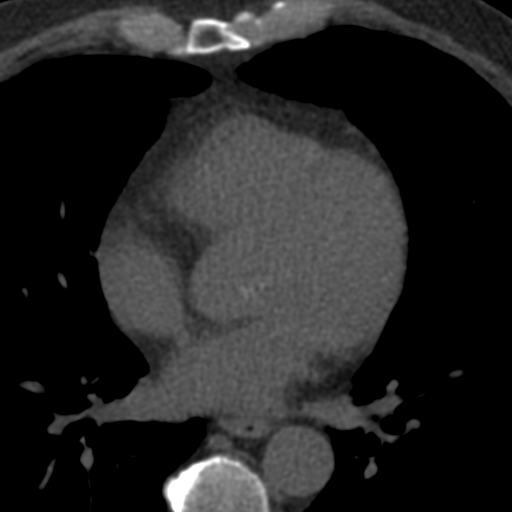
[im 120/172  vessel]
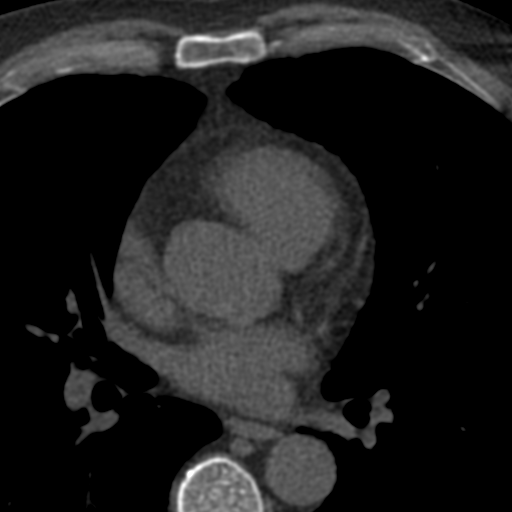
[im 137/172  vessel]
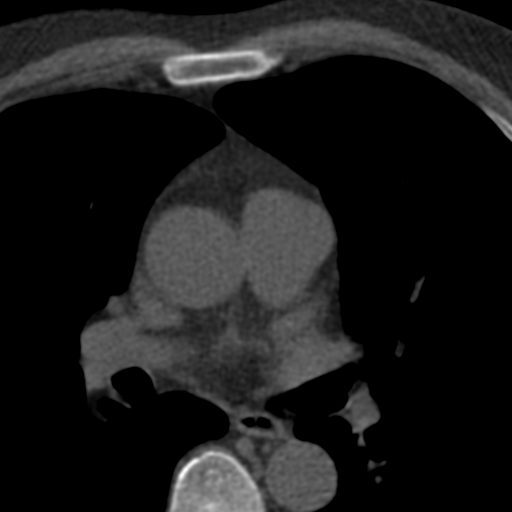
[im 154/172  vessel]
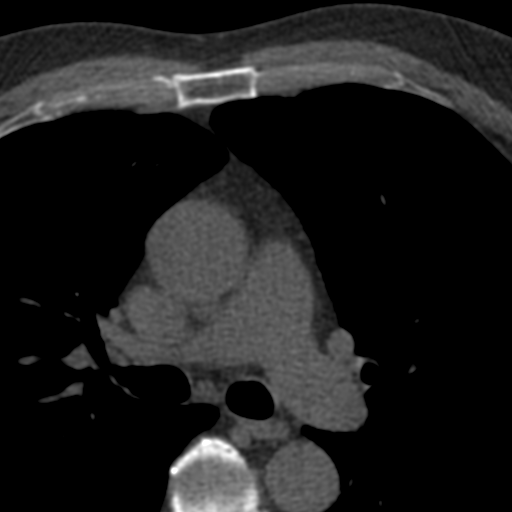
[im 154/172  lung]
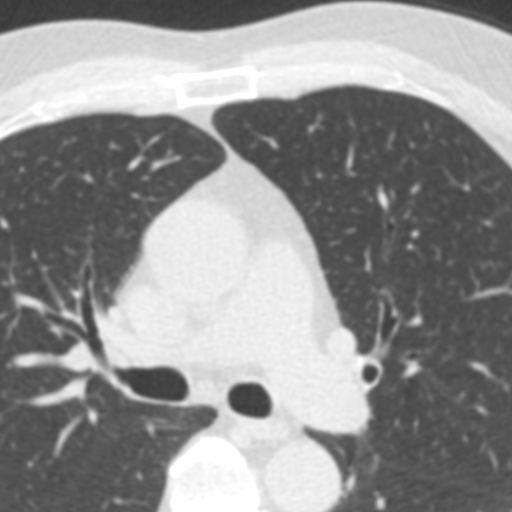

[12 of 20 positions shown; findings below may reference images not displayed]

FINDINGS: CORONARY CALCIUM SCORES:

Left Main: 0

LAD: 0

LCx: 0

RCA: 0

Total Agatston Score: 0

[HOSPITAL] percentile: Zeroth

AORTA MEASUREMENTS:

Ascending Aorta: 37 mm

Descending Aorta: 26 mm

OTHER FINDINGS:

Cardiovascular: The heart is normal in size.

Mediastinum/Nodes: Visualized esophagus and trachea within normal
limits. No mediastinal lymphadenopathy.

Lungs/Pleura: The lungs are clear bilaterally.

Upper Abdomen: Similar appearing, partially visualized lipid rich
right adrenal adenoma. The remaining visualized upper abdomen is
within normal limits.

Musculoskeletal: No acute osseous abnormality.
IMPRESSION: No evidence of coronary atherosclerotic calcifications. The observed
calcium score of 0 is at the zeroth percentile for subjects of the
same age, gender, and race/ethnicity who are free of clinical
cardiovascular disease and treated diabetes.

## 2023-11-04 ENCOUNTER — Other Ambulatory Visit: Payer: Self-pay | Admitting: Student in an Organized Health Care Education/Training Program

## 2023-11-04 DIAGNOSIS — B20 Human immunodeficiency virus [HIV] disease: Secondary | ICD-10-CM

## 2023-11-27 ENCOUNTER — Encounter: Payer: Self-pay | Admitting: Infectious Diseases

## 2024-01-08 ENCOUNTER — Other Ambulatory Visit: Payer: Self-pay | Admitting: Student in an Organized Health Care Education/Training Program

## 2024-01-08 ENCOUNTER — Other Ambulatory Visit: Payer: Self-pay | Admitting: Infectious Diseases

## 2024-01-08 ENCOUNTER — Encounter: Payer: Self-pay | Admitting: Infectious Diseases

## 2024-01-08 ENCOUNTER — Telehealth: Payer: Self-pay

## 2024-01-08 ENCOUNTER — Other Ambulatory Visit: Payer: Self-pay | Admitting: Internal Medicine

## 2024-01-08 DIAGNOSIS — B20 Human immunodeficiency virus [HIV] disease: Secondary | ICD-10-CM

## 2024-01-08 DIAGNOSIS — F32A Depression, unspecified: Secondary | ICD-10-CM

## 2024-01-08 DIAGNOSIS — E782 Mixed hyperlipidemia: Secondary | ICD-10-CM

## 2024-01-08 MED ORDER — TRIUMEQ 600-50-300 MG PO TABS: 1.0000 | ORAL_TABLET | Freq: Every day | ORAL | 3 refills | Status: AC

## 2024-01-08 NOTE — Telephone Encounter (Signed)
 Jada when I refilled Triumeq , need prior authorization was mentioned. Can you f/u? thanks

## 2024-01-08 NOTE — Telephone Encounter (Signed)
 Jason Townsend (Key: [id number]) PA Case ID #: [id number] Need Help? Call us  at [phone] Outcome Additional Information Required This medication or product is on your plan's list of covered drugs. Prior authorization is not required at this time. If your pharmacy has questions regarding the processing of your prescription, please have them call the [facility] pharmacy help desk at [phone]. **Please note: This request was submitted electronically. Formulary lowering, tiering exception, cost reduction and/or pre-benefit determination review (including prospective Medicare hospice reviews) requests cannot be requested using this method of submission. Providers contact us  at [phone] for further assistance. Drug Triumeq  600-50-300MG  tablets ePA cloud logo Form [facility] Electronic Prior Authorization Form [phone] NCPDP)

## 2024-01-08 NOTE — Telephone Encounter (Signed)
 Prior Authorization for patient (Triumeq  600-50-300MG  tablets) came through on cover my meds was submitted awaiting approval or denial.  [id number]

## 2024-01-09 ENCOUNTER — Telehealth: Payer: Self-pay | Admitting: Infectious Diseases

## 2024-01-09 NOTE — Telephone Encounter (Signed)
 Attempted to contact patient to schedule an appointment, but no answer.  Left detailed message to give the clinic a call back and asked to be transferred to one of our staff members.    Copied from CRM [id number]. Topic: Appointments - Scheduling Inquiry for Clinic >> Jan 02, 2024 12:42 PM Chiquita SQUIBB wrote: Reason for CRM: Patient is calling in to schedule his infectious disease yearly appointment with Dr. Eben. Please assist the patient with scheduling.

## 2024-01-30 ENCOUNTER — Encounter: Payer: Self-pay | Admitting: Nurse Practitioner

## 2024-02-06 ENCOUNTER — Ambulatory Visit: Payer: Self-pay | Admitting: Infectious Diseases

## 2024-02-06 ENCOUNTER — Encounter: Payer: Self-pay | Admitting: Infectious Diseases

## 2024-02-06 ENCOUNTER — Other Ambulatory Visit: Payer: Self-pay

## 2024-02-06 VITALS — BP 136/82 | HR 85 | Temp 98.5°F | Ht 73.5 in | Wt 207.0 lb

## 2024-02-06 DIAGNOSIS — R195 Other fecal abnormalities: Secondary | ICD-10-CM | POA: Insufficient documentation

## 2024-02-06 DIAGNOSIS — Z720 Tobacco use: Secondary | ICD-10-CM | POA: Diagnosis not present

## 2024-02-06 DIAGNOSIS — B20 Human immunodeficiency virus [HIV] disease: Secondary | ICD-10-CM | POA: Diagnosis not present

## 2024-02-06 DIAGNOSIS — Z113 Encounter for screening for infections with a predominantly sexual mode of transmission: Secondary | ICD-10-CM

## 2024-02-06 DIAGNOSIS — E785 Hyperlipidemia, unspecified: Secondary | ICD-10-CM

## 2024-02-06 DIAGNOSIS — I10 Essential (primary) hypertension: Secondary | ICD-10-CM

## 2024-02-06 NOTE — Assessment & Plan Note (Addendum)
 Had colon 2019 (1 polyp, hemorrhoids).  Will check his CT abd to f/u his hernia.  Would also consider GI eval for IBS?

## 2024-02-06 NOTE — Assessment & Plan Note (Signed)
 Borderline asx

## 2024-02-06 NOTE — Assessment & Plan Note (Signed)
 On statin, consider increasing dose.

## 2024-02-06 NOTE — Assessment & Plan Note (Signed)
 Encouraged to quit.

## 2024-02-06 NOTE — Progress Notes (Signed)
" ° °  Subjective:    Patient ID: Jason Townsend, male  DOB: 1960-05-03, 64 y.o.        MRN: [id number]   HPI 64 yo M with hx of HIV+ (dx 08-2010), currently on triumeq  (06-2013), initially on truvada/darunavir-norvir /intelence, HLA-, prev K103N, previously followed at [facility].  He also has a hx of depression.  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  They feel well, and would not notice except for scar.  Got married 03-2017, positive, in care at [facility]).    Had lung CA screening CT 04-2022: birads1.    Still smokes <1/2 ppd. Has tried buproprion and did not like.  Has 2 bourbon every night.   Had appendix out 06-2022.    Is on zoloft  50mg  qday. Also in therapy, going well.   Covid and flu and pcv uptodate  Has been feeling bloated since surgery. Has a hernia prior to surgery. Has loose BM 85% of the time. Has some leakage as well. Apatite is decreased.  Working as research scientist (medical).   HIV 1 RNA Quant (Copies/mL)  Date Value  09/24/2021 <20 (H)  08/31/2021 1,440 (H)  11/27/2020 251 (H)   HIV-1 RNA Viral Load (copies/mL)  Date Value  02/22/2023 120  05/18/2022 90   CD4 T Cell Abs (/uL)  Date Value  02/22/2023 1,096  05/18/2022 1,136  08/31/2021 745     Health Maintenance  Topic Date Due   Zoster Vaccines- Shingrix (1 of 2) 11/24/1981   Hepatitis B Vaccines 19-59 Average Risk (1 of 3 - Risk 3-dose series) Never done   COVID-19 Vaccine (7 - 2025-26 season) 09/25/2023   DTaP/Tdap/Td (2 - Td or Tdap) 09/28/2027   Colonoscopy  12/02/2027   Pneumococcal Vaccine: 50+ Years  Completed   Influenza Vaccine  Completed   Hepatitis C Screening  Completed   HIV Screening  Completed   HPV VACCINES  Aged Out   Meningococcal B Vaccine  Aged Out      Review of Systems  Constitutional:  Positive for malaise/fatigue. Negative for chills, fever and weight loss.  HENT:  Positive for congestion.   Gastrointestinal:   Positive for diarrhea. Negative for abdominal pain (bloating), blood in stool, constipation and melena.  Genitourinary:  Negative for dysuria.    Please see HPI. All other systems reviewed and negative.     Objective:  Physical Exam Vitals reviewed.  Constitutional:      General: He is not in acute distress.    Appearance: He is not toxic-appearing.  HENT:     Mouth/Throat:     Mouth: Mucous membranes are moist.     Pharynx: No oropharyngeal exudate.  Eyes:     Extraocular Movements: Extraocular movements intact.     Pupils: Pupils are equal, round, and reactive to light.  Cardiovascular:     Rate and Rhythm: Normal rate and regular rhythm.  Pulmonary:     Effort: Pulmonary effort is normal.     Breath sounds: Normal breath sounds.  Musculoskeletal:        General: Normal range of motion.     Cervical back: Normal range of motion and neck supple.     Right lower leg: No edema.     Left lower leg: No edema.  Neurological:     Mental Status: He is alert.           Assessment & Plan:  "

## 2024-02-06 NOTE — Assessment & Plan Note (Signed)
 Will continue his current meds (triumeq ) He is doing well Will repeat his labs at f/u  Rtc in 9 months.

## 2024-02-13 ENCOUNTER — Telehealth: Payer: Self-pay | Admitting: Physician Assistant

## 2024-02-13 NOTE — Telephone Encounter (Signed)
 Pt medical records that where requested from [facility] [place] is in [place] box up front

## 2024-03-28 ENCOUNTER — Ambulatory Visit: Admitting: Nurse Practitioner

## 2024-09-19 ENCOUNTER — Encounter: Admitting: Family
